# Patient Record
Sex: Male | Born: 1970 | Race: White | Hispanic: No | Marital: Single | Smoking: Current every day smoker
Health system: Southern US, Community
[De-identification: ages and names within clinical notes are randomized; demographics above are authoritative.]

## PROBLEM LIST (undated history)

## (undated) DIAGNOSIS — F329 Major depressive disorder, single episode, unspecified: Secondary | ICD-10-CM

## (undated) DIAGNOSIS — Z21 Asymptomatic human immunodeficiency virus [HIV] infection status: Secondary | ICD-10-CM

## (undated) DIAGNOSIS — B20 Human immunodeficiency virus [HIV] disease: Secondary | ICD-10-CM

## (undated) DIAGNOSIS — F419 Anxiety disorder, unspecified: Secondary | ICD-10-CM

## (undated) DIAGNOSIS — F32A Depression, unspecified: Secondary | ICD-10-CM

## (undated) HISTORY — PX: HERNIA REPAIR: SHX51

---

## 2010-05-28 ENCOUNTER — Ambulatory Visit: Payer: Self-pay | Admitting: Adult Health

## 2010-05-28 DIAGNOSIS — B20 Human immunodeficiency virus [HIV] disease: Secondary | ICD-10-CM

## 2010-05-28 LAB — CONVERTED CEMR LAB
ALT: 19 units/L (ref 0–53)
AST: 22 units/L (ref 0–37)
Alkaline Phosphatase: 67 units/L (ref 39–117)
BUN: 13 mg/dL (ref 6–23)
Bilirubin Urine: NEGATIVE
Bilirubin, Direct: 0.1 mg/dL (ref 0.0–0.3)
Calcium: 9.8 mg/dL (ref 8.4–10.5)
Cholesterol: 126 mg/dL (ref 0–200)
Glucose, Bld: 69 mg/dL — ABNORMAL LOW (ref 70–99)
Glucose, Urine, Semiquant: NEGATIVE
Indirect Bilirubin: 0.4 mg/dL (ref 0.0–0.9)
Nitrite: NEGATIVE
Urobilinogen, UA: NEGATIVE
pH: 6

## 2010-05-29 ENCOUNTER — Encounter: Payer: Self-pay | Admitting: Adult Health

## 2010-05-29 DIAGNOSIS — F329 Major depressive disorder, single episode, unspecified: Secondary | ICD-10-CM

## 2010-05-29 DIAGNOSIS — F1910 Other psychoactive substance abuse, uncomplicated: Secondary | ICD-10-CM

## 2010-06-06 ENCOUNTER — Encounter: Payer: Self-pay | Admitting: Adult Health

## 2010-06-11 ENCOUNTER — Telehealth: Payer: Self-pay | Admitting: Adult Health

## 2010-06-15 ENCOUNTER — Ambulatory Visit: Payer: Self-pay | Admitting: Adult Health

## 2010-06-25 ENCOUNTER — Encounter: Payer: Self-pay | Admitting: Adult Health

## 2010-07-16 ENCOUNTER — Ambulatory Visit: Admit: 2010-07-16 | Payer: Self-pay | Admitting: Adult Health

## 2010-07-24 ENCOUNTER — Ambulatory Visit
Admission: RE | Admit: 2010-07-24 | Discharge: 2010-07-24 | Payer: Self-pay | Source: Home / Self Care | Attending: Adult Health | Admitting: Adult Health

## 2010-07-24 ENCOUNTER — Encounter: Payer: Self-pay | Admitting: Adult Health

## 2010-07-24 DIAGNOSIS — Z8709 Personal history of other diseases of the respiratory system: Secondary | ICD-10-CM | POA: Insufficient documentation

## 2010-07-24 LAB — CONVERTED CEMR LAB
ALT: 22 units/L (ref 0–53)
AST: 21 units/L (ref 0–37)
Albumin: 4.8 g/dL (ref 3.5–5.2)
BUN: 15 mg/dL (ref 6–23)
Bilirubin, Direct: 0.1 mg/dL (ref 0.0–0.3)
CO2: 25 meq/L (ref 19–32)
Chloride: 106 meq/L (ref 96–112)
Glucose, Bld: 80 mg/dL (ref 70–99)
Phosphorus: 4.1 mg/dL (ref 2.3–4.6)
Potassium: 4.2 meq/L (ref 3.5–5.3)
Total Protein: 7.3 g/dL (ref 6.0–8.3)

## 2010-08-07 NOTE — Assessment & Plan Note (Signed)
Summary: STUDY APPT/ LH   Vital Signs:  Patient profile:   40 year old male Height:      72 inches Weight:      178.25 pounds BMI:     24.26 Temp:     98.3 degrees F oral Pulse rate:   75 / minute Resp:     16 per minute BP sitting:   123 / 90  (right arm) Is Patient Diabetic? No Research Study Name: ARDENT Pain Assessment Patient in pain? no      Patient here for his first visit with [place]. He is transferring from [facility] on study A5257. He is currently on Truvada, Darunavir and Ritonavir and will be supplied with Truvada and Darunavir through the study. He says he is almost out of Norvir, and I gave him a bottle from the clinic supply until he can get a prescription filled. He is unsure of his copays and I am assisting him through his company's mail delivery supplier. He may need to get them through the [place] assistance program if his co-pays are too high. He has a med ins. policy that has a cap on prescriptions. He needs to see the NP as a new patient and also the eligibility specialist for [employer]. I am requesting clinic records from [facility] since I have access to his study chart only. He apparently has not been seen there since Feb 2011 and had missed a couple of study appts. He was supposed to have been followed up for hematuria through Urology, but had not kept any of those appts. 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 He c/o depression, trouble sleeping, fatigue and Lt knee bothering him. He says it has been giving out on him and doesn't feel right. He currently takes no other medications. He says he is dating a woman from [place] who is also HIV positive and had questions about transmission risks. I will schedule him an appt. with Nida Boatman once his labs come back. He will return to see me in January which will get him back on schedule with the study.Deirdre Evener RN  May 28, 2010 2:35 PM     Other Orders: Est. Patient Research Study  [phone]) T-Basic Metabolic Panel [phone]) T-Hepatic Function [phone]) T-Lipid Profile [phone]) T-Urinalysis Dipstick only [phone]) T-Phosphorus [phone])  Laboratory Results   Urine Tests  Date/Time Recieved: 05/28/10 1145 Date/Time Reported: 05/28/10 1241  Routine Urinalysis   Color: yellow Appearance: Clear Glucose: negative   (Normal Range: Negative) Bilirubin: negative   (Normal Range: Negative) Ketone: negative   (Normal Range: Negative) Spec. Gravity: 1.025   (Normal Range: 1.003-1.035) Blood: moderate   (Normal Range: Negative) pH: 6.0   (Normal Range: 5.0-8.0) Protein: negative   (Normal Range: Negative) Urobilinogen: negative   (Normal Range: 0-1) Nitrite: negative   (Normal Range: Negative) Leukocyte Esterace: negative   (Normal Range: Negative)

## 2010-08-07 NOTE — Miscellaneous (Signed)
  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 Added new problem of DEPRESSION (ICD-311) Medications: Added new medication of TRUVADA 200-300 MG TABS (EMTRICITABINE-TENOFOVIR) 1 by mouth daily Added new medication of PREZISTA 400 MG TABS (DARUNAVIR ETHANOLATE) 2 by mouth daily Added new medication of NORVIR 100 MG TABS (RITONAVIR) 1 by mouth daily Allergies: Added new allergy or adverse reaction of SULFAMETHOXAZOLE-TMP DS (SULFAMETHOXAZOLE-TRIMETHOPRIM) Added new allergy or adverse reaction of DAPSONE (DAPSONE) Observations: Added new observation of DRUG USE: Yes (05/29/2010 9:29) Added new observation of HIV INIT DX: 01/12/2008 (05/29/2010 9:29) Added new observation of ALCOHOL USE: occassional (05/29/2010 9:29) Added new observation of ALCOHOL COMM: Yes (05/29/2010 9:29) Added new observation of SMOK HX PPD: 1  (05/29/2010 9:29) Added new observation of SMOK STATUS: Current  (05/29/2010 9:29) Added new observation of NKA: F  (05/29/2010 9:29)     Infectious Disease New Patient Intake  Referring MD/Agency: [employer] Medical Records: Requested Medical Insurance: Yes     Insurance Type: Other   Other Insurance: [employer] Does insurance cover prescriptions? Yes Our patient has been informed that medication assistance programs are available.  Our Co-ordinator will be meeting with the patient during this visit to discuss financial and medication assistance.    Medical History/Family History Medication Allergies: Yes   Current Allergies:  ! SULFAMETHOXAZOLE-TMP DS (SULFAMETHOXAZOLE-TRIMETHOPRIM) ! DAPSONE (DAPSONE) Medications: Yes   Current Meds:  TRUVADA 200-300 MG TABS (EMTRICITABINE-TENOFOVIR) 1 by mouth daily PREZISTA 400 MG TABS (DARUNAVIR ETHANOLATE) 2 by mouth daily NORVIR 100 MG TABS (RITONAVIR) 1 by mouth daily   Family History Heart disease: Yes   MI at  83 Cancer: Yes   Comment:  Grandfather lung cancer Smoking History: Current Smoking (packs/day): 1  5.Used to smoke (packs/day): 1  Behavioral Health Assessment Information Have you ever been diagnosed with depression or mental illness? Yes  Diagnosis: Depression Do you drink alcohol? Yes Frequency: occassional Do you use recreational drugs? Yes Type of Use: HX of marijuana, crack, meth Recreational drug use: Yes  HIV Intake Information When did you first test positive for HIV? 01/12/2008 Type of test Conducted: oraquick   Test [facility] Performed At: [facility]   Was this your first time ever being tested or HIV? Yes    Method of Exposure to HIV: Have you ever been under the care of a physician for being HIV positive? Yes  Newly Diagnosed Patients   Have you ever taken HIV medications? Yes Are you currently taking HIV medications? Yes

## 2010-08-07 NOTE — Miscellaneous (Signed)
Summary: HIPAA Restrictions  HIPAA Restrictions   Imported By: Florinda Marker 05/29/2010 11:47:37  _____________________________________________________________________  External Attachment:    Type:   Image     Comment:   External Document

## 2010-08-07 NOTE — Consult Note (Signed)
Summary: [facility] 08/01/08  [facility] 08/01/08   Imported By: Florinda Marker 06/06/2010 09:59:36  _____________________________________________________________________  External Attachment:    Type:   Image     Comment:   External Document

## 2010-08-07 NOTE — Miscellaneous (Signed)
Summary: HIV-1 RNA, CD4 (RESEARCH)  Clinical Lists Changes  Observations: Added new observation of CD4 COUNT: 669 microliters (05/28/2010 14:50) Added new observation of HIV1RNA QA: 40 copies/mL (05/28/2010 14:50)

## 2010-08-09 NOTE — Progress Notes (Signed)
  Phone Note Call from Patient Call back at Home Phone [phone]   Caller: Patient Reason for Call: Acute Illness Summary of Call: Norman Brooks called and said his urinary problem was getting worse. Over more than a week he has been having dark (blood?) tinged urine with some discomfort when he voids. Frequency of voiding has increased also. He has not had an intake yet with a Norman Brooks. We scheduled him an appt this Friday at 9am with [facility] as a new intake. I told him to go to an Urgent Care if signs got worse. I also told him to bring his info in to sign up for [employer].Deirdre Evener RN  June 11, 2010 10:51 AM Initial call taken by: Deirdre Evener RN,  June 11, 2010 10:51 AM

## 2010-08-09 NOTE — Consult Note (Signed)
Summary: [facility]   Imported By: Florinda Marker 06/25/2010 15:24:43  _____________________________________________________________________  External Attachment:    Type:   Image     Comment:   External Document

## 2010-08-09 NOTE — Miscellaneous (Signed)
Summary: [facility]   Imported By: Florinda Marker 06/25/2010 16:02:54  _____________________________________________________________________  External Attachment:    Type:   Image     Comment:   External Document

## 2010-08-29 NOTE — Miscellaneous (Signed)
Summary: HIV-1 RNA, CD4 (RESEARCH)  Clinical Lists Changes  Observations: Added new observation of CD4 COUNT: 652 microliters (07/24/2010 10:02) Added new observation of HIV1RNA QA: 82 copies/mL (07/24/2010 10:02)

## 2010-09-03 ENCOUNTER — Ambulatory Visit: Payer: Self-pay | Admitting: Adult Health

## 2010-09-05 ENCOUNTER — Ambulatory Visit (INDEPENDENT_AMBULATORY_CARE_PROVIDER_SITE_OTHER): Payer: BC Managed Care – PPO | Admitting: Adult Health

## 2010-09-05 ENCOUNTER — Ambulatory Visit (HOSPITAL_COMMUNITY)
Admission: RE | Admit: 2010-09-05 | Discharge: 2010-09-05 | Disposition: A | Discharge status: Home / Self Care | Payer: BC Managed Care – PPO | Source: Ambulatory Visit | Attending: Infectious Diseases | Admitting: Infectious Diseases

## 2010-09-05 ENCOUNTER — Encounter: Payer: Self-pay | Admitting: Adult Health

## 2010-09-05 ENCOUNTER — Other Ambulatory Visit: Payer: Self-pay | Admitting: Infectious Diseases

## 2010-09-05 DIAGNOSIS — R05 Cough: Secondary | ICD-10-CM

## 2010-09-05 DIAGNOSIS — Z87448 Personal history of other diseases of urinary system: Secondary | ICD-10-CM

## 2010-09-05 DIAGNOSIS — J13 Pneumonia due to Streptococcus pneumoniae: Secondary | ICD-10-CM | POA: Insufficient documentation

## 2010-09-05 DIAGNOSIS — L03119 Cellulitis of unspecified part of limb: Secondary | ICD-10-CM

## 2010-09-05 DIAGNOSIS — R059 Cough, unspecified: Secondary | ICD-10-CM | POA: Insufficient documentation

## 2010-09-05 DIAGNOSIS — L02419 Cutaneous abscess of limb, unspecified: Secondary | ICD-10-CM | POA: Insufficient documentation

## 2010-09-05 DIAGNOSIS — B20 Human immunodeficiency virus [HIV] disease: Secondary | ICD-10-CM

## 2010-09-05 DIAGNOSIS — R509 Fever, unspecified: Secondary | ICD-10-CM | POA: Insufficient documentation

## 2010-09-06 ENCOUNTER — Ambulatory Visit: Payer: Self-pay | Admitting: Adult Health

## 2010-09-13 NOTE — Miscellaneous (Signed)
  Clinical Lists Changes  Problems: Added new problem of PNEUMONIA, LEFT LOWER LOBE (ICD-481)

## 2010-09-13 NOTE — Assessment & Plan Note (Signed)
Summary: 2WK F/U/VS   Vital Signs:  Patient profile:   40 year old male Height:      72 inches Weight:      177.6 pounds BMI:     24.17 O2 Sat:      98 % on Room air Temp:     97.9 degrees F oral Pulse rate:   94 / minute BP sitting:   133 / 77  (right arm)  Vitals Entered By: Alesia Morin CMA (September 05, 2010 4:30 PM)  O2 Flow:  Room air CC: follow-up visit for labs. Pt aslo says that he has been running a fever for about 3 days of about 101 and coughing with green mucus and stuffy nose. Chest hurts and nose stuffiness is moving. Is Patient Diabetic? No Pain Assessment Patient in pain? no      Nutritional Status BMI of 19 -24 = normal Nutritional Status Detail appetite "so-so"  Have you ever been in a relationship where you felt threatened, hurt or afraid?No   Does patient need assistance? Functional Status Self care Ambulation Normal Comments no missed doses   CC:  follow-up visit for labs. Pt aslo says that he has been running a fever for about 3 days of about 101 and coughing with green mucus and stuffy nose. Chest hurts and nose stuffiness is moving.Marland Kitchen  History of Present Illness: Presents for lab f/u, but recent labs were drawn in January 2012.  Also with 5-day hx of cough, head congestion, and fatiugue.  Fever developing over past 3 days with Tmax of 101.  Associated chills and sweats with fever and cough.   (+) SOB, non-exertional fatigue, some DOE, and pleuritic-like CP.  No nausea or vomitting reported.   Cough productive with foul-tasting sputum.  Denies hemoptysis.  Also endorses ongoing pain in (L) knee where he was previously treated for cellulitis on his last visit to clini.  Claims pain (L) knee with stand-alone FWB along with weakness in leg and tenderness to (L) patellar region.  Denies locked joint, significant joint swelling, or open wound with drainage.  Preventive Screening-Counseling & Management  Alcohol-Tobacco     Alcohol drinks/day: occassional    Smoking Status: Current     Packs/Day: 1  Safety-Violence-Falls     Seat Belt Use: yes      Sexual History:  currently monogamous.        Drug Use:  Yes.        Blood Transfusions:  no.    Allergies: 1)  ! Sulfamethoxazole-Tmp Ds (Sulfamethoxazole-Trimethoprim) 2)  ! Dapsone (Dapsone)  Social History: Sexual History:  currently monogamous Blood Transfusions:  no  Review of Systems General:  Complains of chills, fatigue, fever, loss of appetite, malaise, sweats, and weakness. Eyes:  Denies blurring, discharge, double vision, eye irritation, eye pain, halos, and light sensitivity. ENT:  Complains of decreased hearing, earache, nasal congestion, postnasal drainage, sinus pressure, and sore throat; "popping" sensation AU. CV:  Complains of difficulty breathing at night, fatigue, and shortness of breath with exertion; denies fainting, lightheadness, near fainting, palpitations, swelling of feet, and swelling of hands. Resp:  Complains of chest discomfort, chest pain with inspiration, cough, pleuritic, shortness of breath, and sputum productive; denies coughing up blood and wheezing. GI:  Denies abdominal pain, bloody stools, change in bowel habits, constipation, dark tarry stools, diarrhea, excessive appetite, gas, hemorrhoids, indigestion, loss of appetite, nausea, vomiting, vomiting blood, and yellowish skin color. GU:  Denies decreased libido, discharge, dysuria, erectile dysfunction, genital sores, hematuria,  incontinence, nocturia, urinary frequency, and urinary hesitancy. MS:  Complains of joint pain, joint redness, muscle aches, and stiffness. Derm:  Denies changes in color of skin, changes in nail beds, dryness, excessive perspiration, flushing, hair loss, insect bite(s), itching, lesion(s), poor wound healing, and rash. Neuro:  Denies difficulty with concentration, disturbances in coordination, falling down, headaches, inability to speak, memory loss, numbness, poor balance,  seizures, sensation of room spinning, tingling, tremors, visual disturbances, and weakness. Psych:  Complains of irritability; denies alternate hallucination ( auditory/visual), anxiety, depression, easily angered, easily tearful, panic attacks, sense of great danger, suicidal thoughts/plans, thoughts of violence, unusual visions or sounds, and thoughts /plans of harming others.  Physical Exam  General:  alert, well-developed, well-nourished, and well-hydrated.   Head:  normocephalic and atraumatic.   Eyes:  vision grossly intact, pupils equal, pupils round, and pupils reactive to light.   Ears:  R TM retraction and L TM retraction.   Nose:  no external deformity, external erythema, nasal dischargemucosal pallor, L frontal sinus tenderness, L maxillary sinus tenderness, R frontal sinus tenderness, and R maxillary sinus tenderness.   Mouth:  good dentition, no gingival abnormalities, no dental plaque, and postnasal drip.   Neck:  supple, full ROM, and no masses.   Chest Wall:  no deformities, no tenderness, and no masses.   Lungs:  Ronchi heard in upper ant. chest over major airwaysnormal respiratory effort, no intercostal retractions, L decreased breath sounds, and L base crackles.   Heart:  normal rate, regular rhythm, no murmur, no gallop, and no rub.   Abdomen:  soft, non-tender, normal bowel sounds, no distention, no hepatomegaly, and no splenomegaly.   Msk:  Slight erythema over anterior surface of left patellar region with point tenderness.  limited flexion (L) vs (R) at knee.  No swelling or joint instability at (L) knee. Pulses:  R and L carotid,radial,femoral,dorsalis pedis and posterior tibial pulses are full and equal bilaterally Extremities:  No clubbing, cyanosis, edema, or deformity noted.   Neurologic:  alert & oriented X3, cranial nerves II-XII intact, strength normal in all extremities, and gait normal.   Skin:  Intact without suspicious lesions or rashes Cervical Nodes:  Shoddy  LAD A&P Axillary Nodes:  R axillary LN enlarged and L axillary LN enlarged.  Bilaterally non-tender Psych:  Cognition and judgment appear intact. Alert and cooperative with normal attention span and concentration. No apparent delusions, illusions, hallucinations   Impression & Recommendations:  Problem # 1:  COUGH (ICD-786.2)  with fever, SO, DOE, and pleuritic CP concern over developing pneumonia is raised CXR 2-views Avelox 400mg  by mouth once daily x 10 days Cetirizine 10mg  by mouth once daily x 2 weeks then as needed Tussionex 5 cc by mouth q12h as needed Ibuprofen 400-600mg  q4-6 h as needed Force Fliuids Bed rest - No work Go to [facility] or ED if cough worsens or develops post-tussive emesis Call clinic if fevers not better by Friday this week  His updated medication list for this problem includes:    Avelox 400 Mg Tabs (Moxifloxacin hcl) .Marland Kitchen... 1 tablet by mouth once daily x 10 days    Hydrocod Polst-chlorphen Polst 10-8 Mg/58ml Lqcr (Hydrocod polst-chlorphen polst) .Marland Kitchen... 1 teaspoon (5 ml) by mouth every 12 hours as needed cough  Orders: CXR- 2view (CXR)  The following medications were removed from the medication list:    Doxycycline Hyclate 100 Mg Caps (Doxycycline hyclate) .Marland Kitchen... 1 cap by mouth every 12 hours until gone. His updated medication list for this  problem includes:    Avelox 400 Mg Tabs (Moxifloxacin hcl) .Marland Kitchen... 1 tablet by mouth once daily x 10 days    Hydrocod Polst-chlorphen Polst 10-8 Mg/98ml Lqcr (Hydrocod polst-chlorphen polst) .Marland Kitchen... 1 teaspoon (5 ml) by mouth every 12 hours as needed cough  Problem # 2:  CELLULITIS, KNEE, LEFT (ICD-682.6)  Due to the duration and timeline of presenting symptoms, an MRI of the (L) knee is warranted.  Any abnormalities determined should be referred to orthopedics or sports medicine for further evaluation and care. The following medications were removed from the medication list:    Doxycycline Hyclate 100 Mg Caps (Doxycycline hyclate)  .Marland Kitchen... 1 cap by mouth every 12 hours until gone. His updated medication list for this problem includes:    Avelox 400 Mg Tabs (Moxifloxacin hcl) .Marland Kitchen... 1 tablet by mouth once daily x 10 days  Orders: MRI with & without Contrast (MRI w&w/o Contrast) T-Sed Rate (Automated) (36644-03474) Est. 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  Repeat U/A and obtain urine for cytology.  Will come by clinic to give specimen when he returns for his MRI.  Orders: T-Urinalysis (38756-43329) Est. Patient Level IV (51884)  Problem # 4:  HIV INFECTION (ICD-042)  CD4 07/24/2010  652 with VL of 82 copies/ml.  Although small incremental rise in VL noted, it still remains too low to determine a genotype.  If we continue seeing small incremental increases, we may need to consider treatment intensification or changing one class of agents.  Will pend repeat lab values at next scheduled staging blood draw per research protocol.  Meanwhile we will continue Prezista, Norvir, and Truvada therapy.  Verbally acknowledged and agreed with plan. His updated medication list for this problem includes:      Orders: Est. Patient Level IV (16606)  Medications Added to Medication List This Visit: 1)  Avelox 400 Mg Tabs (Moxifloxacin hcl) .Marland Kitchen.. 1 tablet by mouth once daily x 10 days 2)  Hydrocod Polst-chlorphen Polst 10-8 Mg/24ml Lqcr (Hydrocod polst-chlorphen polst) .Marland Kitchen.. 1 teaspoon (5 ml) by mouth every 12 hours as needed cough 3)  Cetirizine Hcl 10 Mg Tabs (Cetirizine hcl) .Marland Kitchen.. 1 tablet by mouth once daily x 2 weeks then prn  Patient Instructions: 1)  Recommend increasing fluid intake for hydration for the next few days. 2)  Take 400-600mg  of Ibuprofen (Advil, Motrin) every 4-6 hours as needed for relief of pain or comfort of fever. 3)  Take antibitoics until gone. 4)  Bed rest - no work for the next  4-5 days. 5)  If couging persists in spite of medications or worsens or induces vomitting go to urgent care or ER for evaluation. 6)  Please get chest X-ray today. 7)  Call clinic by Friday if fever persists. 8)  We will call you with the scheduled date and time of your MRI.  When you come in for the MRI, stop by the clinic to collect another urine sample. Prescriptions: CETIRIZINE HCL 10 MG TABS (CETIRIZINE HCL) 1 tablet by mouth once daily x 2 weeks then PRN  #30 x 2   Entered and Authorized by:   Talmadge Chad NP   Signed by:   Talmadge Chad NP on 09/05/2010   Method used:   Print then Give to Patient   RxID:   [id number] HYDROCOD POLST-CHLORPHEN POLST 10-8 MG/5ML LQCR (HYDROCOD POLST-CHLORPHEN POLST) 1 teaspoon (5 ml)  by mouth every 12 hours as needed cough  #8 oz x 0   Entered and Authorized by:   Talmadge Chad NP   Signed by:   Talmadge Chad NP on 09/05/2010   Method used:   Print then Give to Patient   RxID:   [id number] AVELOX 400 MG TABS (MOXIFLOXACIN HCL) 1 tablet by mouth once daily x 10 days  #10 x 0   Entered and Authorized by:   Talmadge Chad NP   Signed by:   Talmadge Chad NP on 09/05/2010   Method used:   Print then Give to Patient   RxID:   [id number]    Orders Added: 1)  CXR- 2view [CXR] 2)  MRI with & without Contrast [facility] w&w/o Contrast] 3)  T-Urinalysis [81003-65000] 4)  T-Sed Rate (Automated) [84696-29528] 5)  Est. Patient Level IV [41324]

## 2010-09-13 NOTE — Assessment & Plan Note (Signed)
Summary: resh from 1/9 new to you [mkj]   CC:  new pt. to establish, c/o numbness right fingers, rash several different areas, and bump on testicles and left kneecap.  Preventive Screening-Counseling & Management  Alcohol-Tobacco     Alcohol drinks/day: occassional     Smoking Status: Current     Packs/Day: 1  Caffeine-Diet-Exercise     Caffeine use/day: one soda per day     Does Patient Exercise: no  Safety-Violence-Falls     Seat Belt Use: no     Seat Belt Counseling: to use seat belts when in vehicle      Sexual History:  currently monogamous.        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    Comments: pt. declined condoms   Current Allergies (reviewed today): ! SULFAMETHOXAZOLE-TMP DS (SULFAMETHOXAZOLE-TRIMETHOPRIM) ! DAPSONE (DAPSONE) Social History: Sexual History:  currently monogamous Drug Use:  current, Yes crystal meth  Vital Signs:  Patient profile:   40 year old male Height:      72 inches (182.88 cm) Weight:      175.5 pounds (79.77 kg) BMI:     23.89 Temp:     97.8 degrees F (36.56 degrees C) oral Pulse rate:   90 / minute BP sitting:   129 / 99  (right arm)  Vitals Entered By: Wendall Mola CMA Duncan Dull) (July 24, 2010 10:04 AM) CC: new pt. to establish, c/o numbness right fingers, rash several different areas, bump on testicles and left kneecap Is Patient Diabetic? No Pain Assessment Patient in pain? no      Nutritional Status BMI of 19 -24 = normal Nutritional Status Detail appetite "good"  Have you ever been in a relationship where you felt threatened, hurt or afraid?No   Does patient need assistance? Functional Status Self care Ambulation Normal Comments pt. has missed one dose of meds in three months    Other Orders: Influenza Vaccine NON MCR (16109) Pneumococcal Vaccine (60454) Admin 1st Vaccine (09811) TB Skin Test (91478) Admin of Any Addtl Vaccine (29562) New Patient Level V (13086)     Immunizations  Administered:  Influenza Vaccine # 1:    Vaccine Type: Fluvax Non-MCR    Site: right deltoid    Mfr: Novartis    Dose: 0.5 ml    Route: IM    Given by: Wendall Mola CMA ( AAMA)    Exp. Date: 10/07/2010    Lot #: [id number]    VIS given: 01/30/10 version given July 24, 2010.  Pneumonia Vaccine:    Vaccine Type: Pneumovax    Site: right deltoid    Mfr: Merck    Dose: 0.5 ml    Route: IM    Given by: Wendall Mola CMA ( AAMA)    Exp. Date: 11/15/2011    Lot #: [id number]    VIS given: 06/12/09 version given July 24, 2010.  PPD Skin Test:    Vaccine Type: PPD    Site: left forearm    Mfr: Sanofi Pasteur    Dose: 0.1 ml    Route: ID    Given by: Wendall Mola CMA ( AAMA)    Exp. Date: 01/12/2012    Lot #: [id number]  Flu Vaccine Consent Questions:    Do you have a history of severe allergic reactions to this vaccine? no    Any prior history of allergic reactions to egg and/or gelatin? no    Do you have a sensitivity to the preservative Thimersol?  no    Do you have a past history of Guillan-Barre Syndrome? no    Do you currently have an acute febrile illness? no    Have you ever had a severe reaction to latex? no    Vaccine information given and explained to patient? yes

## 2010-10-11 ENCOUNTER — Other Ambulatory Visit: Payer: Self-pay | Admitting: *Deleted

## 2010-10-11 DIAGNOSIS — L03116 Cellulitis of left lower limb: Secondary | ICD-10-CM

## 2010-10-15 ENCOUNTER — Other Ambulatory Visit: Payer: Self-pay | Admitting: Adult Health

## 2010-10-15 DIAGNOSIS — B20 Human immunodeficiency virus [HIV] disease: Secondary | ICD-10-CM

## 2010-10-19 ENCOUNTER — Ambulatory Visit (HOSPITAL_COMMUNITY)
Admission: RE | Admit: 2010-10-19 | Discharge: 2010-10-19 | Payer: BC Managed Care – PPO | Source: Ambulatory Visit | Attending: Adult Health | Admitting: Adult Health

## 2010-10-19 ENCOUNTER — Other Ambulatory Visit (INDEPENDENT_AMBULATORY_CARE_PROVIDER_SITE_OTHER): Payer: BC Managed Care – PPO

## 2010-10-19 DIAGNOSIS — B20 Human immunodeficiency virus [HIV] disease: Secondary | ICD-10-CM

## 2010-10-19 LAB — T-HELPER CELL (CD4) - (RCID CLINIC ONLY): CD4 % Helper T Cell: 35 % (ref 33–55)

## 2010-10-19 LAB — URINALYSIS, ROUTINE W REFLEX MICROSCOPIC
Bilirubin Urine: NEGATIVE
Glucose, UA: NEGATIVE mg/dL
Protein, ur: NEGATIVE mg/dL
Specific Gravity, Urine: 1.031 — ABNORMAL HIGH (ref 1.005–1.030)
pH: 6 (ref 5.0–8.0)

## 2010-10-20 LAB — COMPLETE METABOLIC PANEL WITH GFR
ALT: 20 U/L (ref 0–53)
Albumin: 4.5 g/dL (ref 3.5–5.2)
CO2: 22 mEq/L (ref 19–32)
GFR, Est African American: >60 mL/min (ref >60)
GFR, Est Non African American: >60 mL/min (ref >60)
Glucose, Bld: 98 mg/dL (ref 70–99)
Potassium: 3.7 mEq/L (ref 3.5–5.3)
Sodium: 141 mEq/L (ref 135–145)
Total Protein: 7 g/dL (ref 6.0–8.3)

## 2010-10-20 LAB — CBC WITH DIFFERENTIAL/PLATELET
Hemoglobin: 14.2 g/dL (ref 13.0–17.0)
Lymphocytes Relative: 27 % (ref 12–46)
Lymphs Abs: 2 10*3/uL (ref 0.7–4.0)
Monocytes Relative: 8 % (ref 3–12)
Neutro Abs: 4.4 10*3/uL (ref 1.7–7.7)
Neutrophils Relative %: 61 % (ref 43–77)
Platelets: 288 10*3/uL (ref 150–400)
RBC: 4.65 MIL/uL (ref 4.22–5.81)
WBC: 7.2 10*3/uL (ref 4.0–10.5)

## 2010-10-20 LAB — HEPATITIS C ANTIBODY: HCV Ab: NEGATIVE

## 2010-10-20 LAB — HEPATITIS B SURFACE ANTIGEN: Hepatitis B Surface Ag: NEGATIVE

## 2010-10-20 LAB — HIV-1 RNA ULTRAQUANT REFLEX TO GENTYP+
HIV 1 RNA Quant: 30 copies/mL — ABNORMAL HIGH (ref <20)
HIV-1 RNA Quant, Log: 1.48 {Log} — ABNORMAL HIGH (ref <1.30)

## 2010-10-20 LAB — HEPATITIS B SURFACE ANTIBODY,QUALITATIVE: Hep B S Ab: POSITIVE — AB

## 2010-10-20 LAB — URINALYSIS, MICROSCOPIC ONLY
Bacteria, UA: NONE SEEN
Casts: NONE SEEN

## 2010-11-06 ENCOUNTER — Ambulatory Visit: Payer: BC Managed Care – PPO | Admitting: Adult Health

## 2010-11-16 ENCOUNTER — Ambulatory Visit: Payer: BC Managed Care – PPO | Admitting: Adult Health

## 2010-11-19 ENCOUNTER — Encounter: Payer: Self-pay | Admitting: *Deleted

## 2010-11-23 ENCOUNTER — Ambulatory Visit (INDEPENDENT_AMBULATORY_CARE_PROVIDER_SITE_OTHER): Payer: BC Managed Care – PPO | Admitting: Adult Health

## 2010-11-23 ENCOUNTER — Encounter: Payer: Self-pay | Admitting: *Deleted

## 2010-11-23 ENCOUNTER — Ambulatory Visit (INDEPENDENT_AMBULATORY_CARE_PROVIDER_SITE_OTHER): Payer: BC Managed Care – PPO | Admitting: *Deleted

## 2010-11-23 VITALS — Temp 97.9°F | Resp 16 | Ht 72.0 in | Wt 180.8 lb

## 2010-11-23 VITALS — BP 120/76 | HR 88 | Temp 97.0°F | Ht 72.0 in | Wt 180.0 lb

## 2010-11-23 DIAGNOSIS — B20 Human immunodeficiency virus [HIV] disease: Secondary | ICD-10-CM

## 2010-11-23 DIAGNOSIS — Z202 Contact with and (suspected) exposure to infections with a predominantly sexual mode of transmission: Secondary | ICD-10-CM

## 2010-11-23 DIAGNOSIS — Z113 Encounter for screening for infections with a predominantly sexual mode of transmission: Secondary | ICD-10-CM

## 2010-11-23 LAB — LIPID PANEL
Cholesterol: 176 mg/dL (ref 0–200)
HDL: 49 mg/dL (ref >39)
Total CHOL/HDL Ratio: 3.6 Ratio
Triglycerides: 54 mg/dL (ref <150)

## 2010-11-23 LAB — BASIC METABOLIC PANEL
CO2: 23 mEq/L (ref 19–32)
Calcium: 9.9 mg/dL (ref 8.4–10.5)
Creat: 0.98 mg/dL (ref 0.40–1.50)
Sodium: 141 mEq/L (ref 135–145)

## 2010-11-23 LAB — POCT URINALYSIS DIPSTICK
Leukocytes, UA: NEGATIVE
Nitrite, UA: NEGATIVE
Protein, UA: NEGATIVE
Urobilinogen, UA: 0.2
pH, UA: 6

## 2010-11-23 LAB — PHOSPHORUS: Phosphorus: 3.6 mg/dL (ref 2.3–4.6)

## 2010-11-23 MED ORDER — PENICILLIN G BENZATHINE 1200000 UNIT/2ML IM SUSP: 1.2000 10*6.[IU] | Freq: Once | INTRAMUSCULAR | Status: DC

## 2010-11-23 MED ORDER — PENICILLIN G BENZATHINE 1200000 UNIT/2ML IM SUSP
1.2000 10*6.[IU] | Freq: Once | INTRAMUSCULAR | Status: AC
  Administered 2010-11-23 (×2): 1.2 10*6.[IU] via INTRAMUSCULAR

## 2010-11-23 NOTE — Progress Notes (Signed)
Patient here for week 144 A5257 study. Patient expressed concern about having a checkup on his kidneys. He previously had blood in his urine and an eval by a urologist a few years ago. He was told then he needed to come back for followup in 6 months and it has now been 2 years. UA dipstick showing moderate amount of blood. Continues to have trouble focusing, sleeping and fatigue. He has a girlfriend who is positive also and asked about the need for protection with sex. He is seeing Nida Boatman today also.

## 2010-11-24 LAB — RPR

## 2010-12-18 ENCOUNTER — Encounter: Payer: Self-pay | Admitting: Adult Health

## 2010-12-18 LAB — HIV RNA, QUANTITATIVE, PCR: HIV-1 RNA Viral Load: <40

## 2010-12-18 LAB — CD4/CD8 (T-HELPER/T-SUPPRESSOR CELL): CD8 % Suppressor T Cell: 31.6

## 2010-12-19 ENCOUNTER — Other Ambulatory Visit: Payer: Self-pay | Admitting: *Deleted

## 2010-12-19 DIAGNOSIS — B20 Human immunodeficiency virus [HIV] disease: Secondary | ICD-10-CM

## 2010-12-19 MED ORDER — RITONAVIR 100 MG PO CAPS: 100.0000 mg | ORAL_CAPSULE | Freq: Every day | ORAL | Status: DC

## 2010-12-20 ENCOUNTER — Other Ambulatory Visit: Payer: Self-pay | Admitting: Licensed Clinical Social Worker

## 2010-12-20 DIAGNOSIS — B20 Human immunodeficiency virus [HIV] disease: Secondary | ICD-10-CM

## 2010-12-20 MED ORDER — RITONAVIR 100 MG PO CAPS: 100.0000 mg | ORAL_CAPSULE | Freq: Every day | ORAL | Status: DC

## 2010-12-24 ENCOUNTER — Other Ambulatory Visit: Payer: BC Managed Care – PPO

## 2011-02-27 ENCOUNTER — Other Ambulatory Visit: Payer: BC Managed Care – PPO

## 2011-03-05 ENCOUNTER — Other Ambulatory Visit: Payer: BC Managed Care – PPO

## 2011-03-07 ENCOUNTER — Ambulatory Visit (INDEPENDENT_AMBULATORY_CARE_PROVIDER_SITE_OTHER): Payer: Self-pay | Admitting: *Deleted

## 2011-03-07 ENCOUNTER — Encounter: Payer: Self-pay | Admitting: *Deleted

## 2011-03-07 VITALS — BP 119/78 | HR 86 | Temp 97.9°F | Resp 18 | Wt 190.0 lb

## 2011-03-07 DIAGNOSIS — B20 Human immunodeficiency virus [HIV] disease: Secondary | ICD-10-CM

## 2011-03-07 DIAGNOSIS — Z113 Encounter for screening for infections with a predominantly sexual mode of transmission: Secondary | ICD-10-CM

## 2011-03-07 LAB — HEPATIC FUNCTION PANEL
ALT: 18 U/L (ref 0–53)
AST: 17 U/L (ref 0–37)
Albumin: 4.3 g/dL (ref 3.5–5.2)
Total Protein: 7 g/dL (ref 6.0–8.3)

## 2011-03-07 LAB — BASIC METABOLIC PANEL
BUN: 20 mg/dL (ref 6–23)
Chloride: 108 mEq/L (ref 96–112)
Potassium: 4 mEq/L (ref 3.5–5.3)

## 2011-03-07 NOTE — Progress Notes (Signed)
03/07/2011 @ 3pm: Pt here for research study A5257, week 160. Assessment unchanged since last study visit. Pt stated he accidentally cut himself shaving 2 weeks ago. He found out that a friend had used his razor and he is now concerned if he might have "caught something". He requested an RPR. We will check him for syphilis and check a UDS, UA per Traci Sermon NP. Pt also concerned about history of blood in urine and would like a Urology referral. Nida Boatman will review and discuss findings with patient during scheduled visit next week.  VSS. Non-fasting labs drawn. Medications dispensed. Pt received $20 gift card for visit. Next research visit scheduled for Monday, July 14, 2010 at 3pm. -- Tacey Heap RN

## 2011-03-08 LAB — URINALYSIS
Hgb urine dipstick: NEGATIVE
Leukocytes, UA: NEGATIVE
Protein, ur: NEGATIVE mg/dL
Urobilinogen, UA: 0.2 mg/dL (ref 0.0–1.0)

## 2011-03-08 LAB — DRUG SCREEN, URINE
Benzodiazepines.: NEGATIVE
Methadone: NEGATIVE
Propoxyphene: NEGATIVE

## 2011-03-08 LAB — RPR

## 2011-03-13 ENCOUNTER — Ambulatory Visit: Payer: BC Managed Care – PPO | Admitting: Adult Health

## 2011-03-14 ENCOUNTER — Ambulatory Visit: Payer: Self-pay | Admitting: Adult Health

## 2011-03-27 ENCOUNTER — Telehealth: Payer: Self-pay | Admitting: *Deleted

## 2011-03-27 NOTE — Telephone Encounter (Signed)
Patient called about a referral to Urology advised him did not have a referral from the doctor nor an office note about one. Due to this he would have to come for a visit before we could send him to urologist. Was not able to get in touch with the patient so left a voice mail to have him call the office.

## 2011-04-09 ENCOUNTER — Encounter: Payer: Self-pay | Admitting: Adult Health

## 2011-04-09 LAB — CD4/CD8 (T-HELPER/T-SUPPRESSOR CELL)
CD4%: 39.9
CD8 % Suppressor T Cell: 36.7
CD8: 807

## 2011-04-09 LAB — HIV-1 RNA QUANT-NO REFLEX-BLD: HIV-1 RNA Viral Load: <40

## 2011-04-11 ENCOUNTER — Telehealth: Payer: Self-pay | Admitting: *Deleted

## 2011-04-11 ENCOUNTER — Other Ambulatory Visit: Payer: Self-pay | Admitting: *Deleted

## 2011-04-11 DIAGNOSIS — B20 Human immunodeficiency virus [HIV] disease: Secondary | ICD-10-CM

## 2011-04-11 MED ORDER — RITONAVIR 100 MG PO CAPS: 100.0000 mg | ORAL_CAPSULE | Freq: Every day | ORAL | Status: DC

## 2011-04-11 NOTE — Telephone Encounter (Signed)
Patient states his insurance has been canceled and he will need access to ritonavir. He currently works and has a [employer], [employer]/film/video and will probably not be eligible for ADAP. We will apply for norvir assistance through [employer]. He is to come in tomorrow and sign the application.

## 2011-04-22 ENCOUNTER — Encounter: Payer: Self-pay | Admitting: Infectious Disease

## 2011-04-22 ENCOUNTER — Ambulatory Visit (INDEPENDENT_AMBULATORY_CARE_PROVIDER_SITE_OTHER): Payer: Self-pay | Admitting: Infectious Disease

## 2011-04-22 VITALS — BP 117/78 | HR 76 | Temp 97.4°F | Wt 188.1 lb

## 2011-04-22 DIAGNOSIS — L738 Other specified follicular disorders: Secondary | ICD-10-CM

## 2011-04-22 DIAGNOSIS — N4889 Other specified disorders of penis: Secondary | ICD-10-CM

## 2011-04-22 DIAGNOSIS — L739 Follicular disorder, unspecified: Secondary | ICD-10-CM

## 2011-04-22 DIAGNOSIS — Z7251 High risk heterosexual behavior: Secondary | ICD-10-CM

## 2011-04-22 DIAGNOSIS — B20 Human immunodeficiency virus [HIV] disease: Secondary | ICD-10-CM

## 2011-04-22 DIAGNOSIS — N489 Disorder of penis, unspecified: Secondary | ICD-10-CM | POA: Insufficient documentation

## 2011-04-22 DIAGNOSIS — Z23 Encounter for immunization: Secondary | ICD-10-CM

## 2011-04-22 MED ORDER — RITONAVIR 100 MG PO CAPS: 100.0000 mg | ORAL_CAPSULE | Freq: Every day | ORAL | Status: DC

## 2011-04-22 MED ORDER — DOXYCYCLINE HYCLATE 100 MG PO TABS: 100.0000 mg | ORAL_TABLET | Freq: Two times a day (BID) | ORAL | Status: AC

## 2011-04-22 NOTE — Progress Notes (Signed)
Subjective:    Patient ID: Norman Brooks, male    DOB: 1971-05-18, 40 y.o.   MRN: [id number]  HPI  40 year old Caucasian male with HIV suprremely well controlled on prezista,norvir and truvada with VL undetectable at last check in August. He did run out of his norvir yesterday. He is out of insurance and needs to enroll in [employer] (he receives the prezista and truvada via [facility]) He is concerned for the possibility of syphilis because he has a painless penile lesion that developed approximately a month ago. It is oval in shape on the glans the penis. Is not purulent it is not pruritic. He states he's had lesions like this occur before his penis and is applied topical antibiotics and it resolved. He also is an area of irritation on his rhythm he has had in the past as well. Reviewed his labs from August and his RPR was indeed negative. He recounts a history of having had unprotected sexual intercourse 3 weeks prior to this with another woman whose HIV status was unknown. He claims that he informed her of his status. He's otherwise felt well and without fevers chills nausea penile discharge or systemic symptoms.  Review of Systems  Constitutional: Negative for fever, chills, diaphoresis, activity change, appetite change, fatigue and unexpected weight change.  HENT: Negative for congestion, sore throat, rhinorrhea, sneezing, trouble swallowing and sinus pressure.   Eyes: Negative for photophobia and visual disturbance.  Respiratory: Negative for cough, chest tightness, shortness of breath, wheezing and stridor.   Cardiovascular: Negative for chest pain, palpitations and leg swelling.  Gastrointestinal: Negative for nausea, vomiting, abdominal pain, diarrhea, constipation, blood in stool, abdominal distention and anal bleeding.  Genitourinary: Negative for dysuria, hematuria, flank pain and difficulty urinating.  Musculoskeletal: Negative for myalgias, back pain, joint swelling, arthralgias and gait  problem.  Skin: Positive for rash. Negative for color change, pallor and wound.  Neurological: Negative for dizziness, tremors, weakness and light-headedness.  Hematological: Negative for adenopathy. Does not bruise/bleed easily.  Psychiatric/Behavioral: Negative for behavioral problems, confusion, sleep disturbance, dysphoric mood, decreased concentration and agitation.       Objective:   Physical Exam  Constitutional: He is oriented to person, place, and time. He appears well-developed and well-nourished. No distress.  HENT:  Head: Normocephalic and atraumatic.  Mouth/Throat: Oropharynx is clear and moist. No oropharyngeal exudate.  Eyes: Conjunctivae and EOM are normal. Pupils are equal, round, and reactive to light. No scleral icterus.  Neck: Normal range of motion. Neck supple. No JVD present.  Cardiovascular: Normal rate, regular rhythm and normal heart sounds.  Exam reveals no gallop and no friction rub.   No murmur heard. Pulmonary/Chest: Effort normal and breath sounds normal. No respiratory distress. He has no wheezes. He has no rales. He exhibits no tenderness.  Abdominal: He exhibits no distension and no mass. There is no tenderness. There is no rebound and no guarding.  Genitourinary:    Circumcised. No paraphimosis or penile erythema. No discharge found.  Musculoskeletal: He exhibits no edema and no tenderness.  Lymphadenopathy:    He has no cervical adenopathy.  Neurological: He is alert and oriented to person, place, and time. He has normal reflexes. He exhibits normal muscle tone. Coordination normal.  Skin: Skin is warm and dry. He is not diaphoretic. No erythema. No pallor.  Psychiatric: He has a normal mood and affect. His behavior is normal. Judgment and thought content normal.          Assessment &  Plan:  Penile lesion Syphilis is a possibility and he might not have yet seroconverted. However he has had similar lesiosn int he past and never been RPR  positive in our system and by his account. An atypical manifestation of HSV 2 or 1 is possible. I will check RPR with diluition for prozone, GC and chlamydia from urine and serum HSV1 and 2 antibodies. This could be a contact dermatitis or eczema as well. 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 Could be in midst of primary to secondary syphilis but history of repeated flares of this and penile lesion with negatie RPR argues against this  High risk sexual behavior counselled on dangers to himself and others. Need to inform others of status and use condoms. Fortunately his VL is undetectable and by his history so is that of his wife (who is HIV positive)  HIV INFECTION Superbly controlled but needs ADAP, ASAP. Norvir rx written

## 2011-04-22 NOTE — Assessment & Plan Note (Signed)
Superbly controlled but needs ADAP, ASAP. Norvir rx written

## 2011-04-22 NOTE — Assessment & Plan Note (Signed)
counselled on dangers to himself and others. Need to inform others of status and use condoms. Fortunately his VL is undetectable and by his history so is that of his wife (who is HIV positive)

## 2011-04-22 NOTE — Assessment & Plan Note (Signed)
Syphilis is a possibility and he might not have yet seroconverted. However he has had similar lesiosn int he past and never been RPR positive in our system and by his account. An atypical manifestation of HSV 2 or 1 is possible. I will check RPR with diluition for prozone, GC and chlamydia from urine and serum HSV1 and 2 antibodies. This could be a contact dermatitis or eczema as well. Will bring back in one month

## 2011-04-22 NOTE — Assessment & Plan Note (Signed)
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 Could be in midst of primary to secondary syphilis but history of repeated flares of this and penile lesion with negatie RPR argues against this

## 2011-04-23 ENCOUNTER — Other Ambulatory Visit: Payer: Self-pay | Admitting: *Deleted

## 2011-04-23 DIAGNOSIS — B20 Human immunodeficiency virus [HIV] disease: Secondary | ICD-10-CM

## 2011-04-23 LAB — GC/CHLAMYDIA PROBE AMP, URINE: Chlamydia, Swab/Urine, PCR: NEGATIVE

## 2011-04-23 MED ORDER — DARUNAVIR ETHANOLATE 400 MG PO TABS: 800.0000 mg | ORAL_TABLET | Freq: Every day | ORAL | Status: AC

## 2011-04-23 MED ORDER — EMTRICITABINE-TENOFOVIR DF 200-300 MG PO TABS: 1.0000 | ORAL_TABLET | Freq: Every day | ORAL | Status: AC

## 2011-04-23 MED ORDER — RITONAVIR 100 MG PO CAPS: 100.0000 mg | ORAL_CAPSULE | Freq: Every day | ORAL | Status: DC

## 2011-04-24 ENCOUNTER — Other Ambulatory Visit: Payer: Self-pay | Admitting: Infectious Disease

## 2011-04-24 LAB — HSV(HERPES SIMPLEX VRS) I + II AB-IGG: HSV 1 Glycoprotein G Ab, IgG: 15.74 IV — ABNORMAL HIGH

## 2011-04-29 ENCOUNTER — Other Ambulatory Visit: Payer: Self-pay | Admitting: *Deleted

## 2011-04-29 DIAGNOSIS — B20 Human immunodeficiency virus [HIV] disease: Secondary | ICD-10-CM

## 2011-04-29 MED ORDER — RITONAVIR 100 MG PO CAPS: 100.0000 mg | ORAL_CAPSULE | Freq: Every day | ORAL | Status: DC

## 2011-05-02 ENCOUNTER — Other Ambulatory Visit: Payer: Self-pay | Admitting: *Deleted

## 2011-05-02 DIAGNOSIS — B20 Human immunodeficiency virus [HIV] disease: Secondary | ICD-10-CM

## 2011-05-02 MED ORDER — RITONAVIR 100 MG PO CAPS: 100.0000 mg | ORAL_CAPSULE | Freq: Every day | ORAL | Status: DC

## 2011-05-05 ENCOUNTER — Encounter: Payer: Self-pay | Admitting: *Deleted

## 2011-05-05 NOTE — Progress Notes (Signed)
Megan from [facility] said Nygel has gotten approval through ADAP for his norvir. I checked with Pearley to confirm and reminded him he will have to renew every 6 months. I had submitted an application through [employer] and will call and have it canceled.

## 2011-05-07 ENCOUNTER — Other Ambulatory Visit: Payer: Self-pay | Admitting: *Deleted

## 2011-05-07 NOTE — Telephone Encounter (Signed)
Told him we have bottles of norvir here for him. He may have to get them tomorrow. Not sure he can make it by 5

## 2011-05-08 ENCOUNTER — Telehealth: Payer: Self-pay | Admitting: Licensed Clinical Social Worker

## 2011-05-08 DIAGNOSIS — B20 Human immunodeficiency virus [HIV] disease: Secondary | ICD-10-CM

## 2011-05-08 NOTE — Telephone Encounter (Signed)
Patient called because his patient assistance medications had arrived and he wanted his girlfriend to pick them up for him. Her name is Nickola Major and she will be here today getting her lab work done.

## 2011-05-08 NOTE — Telephone Encounter (Signed)
Pt will get his samples today

## 2011-05-22 ENCOUNTER — Ambulatory Visit: Payer: Self-pay | Admitting: Infectious Disease

## 2011-05-28 ENCOUNTER — Ambulatory Visit: Payer: Self-pay | Admitting: Adult Health

## 2011-06-03 ENCOUNTER — Ambulatory Visit: Payer: Self-pay | Admitting: Internal Medicine

## 2011-07-15 ENCOUNTER — Ambulatory Visit (INDEPENDENT_AMBULATORY_CARE_PROVIDER_SITE_OTHER): Payer: Self-pay | Admitting: *Deleted

## 2011-07-15 VITALS — BP 125/84 | HR 92 | Temp 98.0°F | Resp 18 | Wt 188.5 lb

## 2011-07-15 DIAGNOSIS — B20 Human immunodeficiency virus [HIV] disease: Secondary | ICD-10-CM

## 2011-07-15 NOTE — Progress Notes (Signed)
Assessment unchanged since last study visit. Pt denies any new findings, problems or symptoms. Informed patient that study will be closing and the next visit will be his last. Currently uninsured and getting Norvir through ADAP. Pt given information to get in touch with Britta Mccreedy so that he can renew his ADAP and apply for RW and [employer]. Pt verbalized understanding.   Pt questioned a urology referral, for blood in urine, that Traci Sermon NP had spoke about. There is no talk of this in his notes. I see he had moderate amount of blood in urine 11/2010. Spoke with Dr. Daiva Eves about this and he said to get a UA if having symptoms and he could refer to urologist if necessary. Pt currently denies any pain, difficulty, changes in frequency, stream, or color changes while urinating. Pt and I decided to wait since this is not an urgent matter. Nonfasting labs were drawn and $20.00 gift card given for study visit. Study medications were dispensed. Next appointment scheduled for Monday, October 21, 2011 at 9:00am. Tacey Heap RN

## 2011-07-16 LAB — BASIC METABOLIC PANEL
BUN: 20 mg/dL (ref 6–23)
Calcium: 9.9 mg/dL (ref 8.4–10.5)
Glucose, Bld: 91 mg/dL (ref 70–99)
Potassium: 4.2 mEq/L (ref 3.5–5.3)
Sodium: 140 mEq/L (ref 135–145)

## 2011-07-16 LAB — PHOSPHORUS: Phosphorus: 3.7 mg/dL (ref 2.3–4.6)

## 2011-07-26 ENCOUNTER — Encounter: Payer: Self-pay | Admitting: Adult Health

## 2011-07-26 LAB — CD4/CD8 (T-HELPER/T-SUPPRESSOR CELL)
CD4: 686
CD8: 656

## 2011-07-30 ENCOUNTER — Ambulatory Visit: Payer: Self-pay | Admitting: Infectious Disease

## 2011-07-30 ENCOUNTER — Telehealth: Payer: Self-pay | Admitting: Licensed Clinical Social Worker

## 2011-07-30 NOTE — Telephone Encounter (Signed)
Rescheduled patient to 08/12/11 he missed his appointment today with Dr. Daiva Eves.

## 2011-08-01 ENCOUNTER — Encounter: Payer: Self-pay | Admitting: Infectious Disease

## 2011-08-12 ENCOUNTER — Ambulatory Visit (INDEPENDENT_AMBULATORY_CARE_PROVIDER_SITE_OTHER): Payer: Self-pay | Admitting: Infectious Disease

## 2011-08-12 ENCOUNTER — Encounter: Payer: Self-pay | Admitting: Infectious Disease

## 2011-08-12 VITALS — BP 144/91 | HR 98 | Temp 98.3°F | Ht 72.0 in | Wt 186.0 lb

## 2011-08-12 DIAGNOSIS — F329 Major depressive disorder, single episode, unspecified: Secondary | ICD-10-CM

## 2011-08-12 DIAGNOSIS — G4700 Insomnia, unspecified: Secondary | ICD-10-CM

## 2011-08-12 DIAGNOSIS — F1910 Other psychoactive substance abuse, uncomplicated: Secondary | ICD-10-CM

## 2011-08-12 DIAGNOSIS — F322 Major depressive disorder, single episode, severe without psychotic features: Secondary | ICD-10-CM | POA: Insufficient documentation

## 2011-08-12 DIAGNOSIS — Z7251 High risk heterosexual behavior: Secondary | ICD-10-CM

## 2011-08-12 DIAGNOSIS — B20 Human immunodeficiency virus [HIV] disease: Secondary | ICD-10-CM

## 2011-08-12 MED ORDER — TRAZODONE HCL 50 MG PO TABS: 50.0000 mg | ORAL_TABLET | Freq: Every day | ORAL | Status: DC

## 2011-08-12 MED ORDER — CITALOPRAM HYDROBROMIDE 20 MG PO TABS: 20.0000 mg | ORAL_TABLET | Freq: Every day | ORAL | Status: DC

## 2011-08-12 NOTE — Assessment & Plan Note (Signed)
HIV well controlled, admits to missing a few doses. Good he is on PI based regimen

## 2011-08-12 NOTE — Assessment & Plan Note (Signed)
Still ongoing issue for him. His insight is poor

## 2011-08-12 NOTE — Progress Notes (Signed)
Subjective:    Patient ID: Norman Brooks, male    DOB: 10-26-1970, 41 y.o.   MRN: [id number]  HPI  Norman Brooks is a 41 y.o. male who is doing reasonably  well on their antiviral regimen, with low viral load and health cd4 count.   He presents to clinic for followup today and is severely depressed. He denies suicidal or homicidal ideation. He contracts for safety. He endorses active IV methamphetamine use. He denies etoh use. He endorses NEVER using condoms with his HIV positive wife who he states is undetectable.   He statest that if he were back on adderal he might not use so much IV meth. He accepted rx for SSRI but walked out the door before I could hand it to him. He refused to see [facility] counselor. I spent greater than 45 minutes with the patient including greater than 50% of time in face to face counsel of the patient and in coordination of their care.    Review of Systems  Constitutional: Negative for fever, chills, diaphoresis, activity change, appetite change, fatigue and unexpected weight change.  HENT: Negative for congestion, sore throat, rhinorrhea, sneezing, trouble swallowing and sinus pressure.   Eyes: Negative for photophobia and visual disturbance.  Respiratory: Negative for cough, chest tightness, shortness of breath, wheezing and stridor.   Cardiovascular: Negative for chest pain, palpitations and leg swelling.  Gastrointestinal: Negative for nausea, vomiting, abdominal pain, diarrhea, constipation, blood in stool, abdominal distention and anal bleeding.  Genitourinary: Negative for dysuria, hematuria, flank pain and difficulty urinating.  Musculoskeletal: Negative for myalgias, back pain, joint swelling, arthralgias and gait problem.  Skin: Negative for color change, pallor, rash and wound.  Neurological: Negative for dizziness, tremors, weakness and light-headedness.  Hematological: Negative for adenopathy. Does not bruise/bleed easily.  Psychiatric/Behavioral:  Positive for confusion, sleep disturbance, dysphoric mood and agitation. Negative for suicidal ideas, behavioral problems, self-injury and decreased concentration.       Objective:   Physical Exam  Constitutional: He is oriented to person, place, and time. He appears well-developed and well-nourished. No distress.  HENT:  Head: Normocephalic and atraumatic.  Mouth/Throat: Oropharynx is clear and moist. No oropharyngeal exudate.  Eyes: Conjunctivae and EOM are normal. Pupils are equal, round, and reactive to light. No scleral icterus.  Neck: Normal range of motion. Neck supple. No JVD present.  Cardiovascular: Normal rate, regular rhythm and normal heart sounds.  Exam reveals no gallop and no friction rub.   No murmur heard. Pulmonary/Chest: Effort normal and breath sounds normal. No respiratory distress. He has no wheezes. He has no rales. He exhibits no tenderness.  Abdominal: He exhibits no distension and no mass. There is no tenderness. There is no rebound and no guarding.  Musculoskeletal: He exhibits no edema and no tenderness.  Lymphadenopathy:    He has no cervical adenopathy.  Neurological: He is alert and oriented to person, place, and time. He has normal reflexes. He exhibits normal muscle tone. Coordination normal.  Skin: Skin is warm and dry. He is not diaphoretic. No erythema. No pallor.  Psychiatric: Thought content normal. His affect is blunt, labile and inappropriate. He is withdrawn. Thought content is not delusional. He expresses impulsivity and inappropriate judgment. He exhibits a depressed mood. He expresses no suicidal plans and no homicidal plans.          Assessment & Plan:  HIV INFECTION HIV well controlled, admits to missing a few doses. Good he is on PI based regimen  High risk sexual  behavior He is NOT at all receptive to idea of using protection. Fortunately his VL is undetectable  DEPRESSION Will start him on SSRI. Ordered referral to  psychiatry  SUBSTANCE ABUSE, MULTIPLE Still ongoing issue for him. His insight is poor  Insomnia Start trazadone

## 2011-08-12 NOTE — Assessment & Plan Note (Signed)
Will start him on SSRI. Ordered referral to psychiatry

## 2011-08-12 NOTE — Assessment & Plan Note (Signed)
Start trazadone

## 2011-08-12 NOTE — Assessment & Plan Note (Signed)
He is NOT at all receptive to idea of using protection. Fortunately his VL is undetectable

## 2011-08-19 DIAGNOSIS — Z202 Contact with and (suspected) exposure to infections with a predominantly sexual mode of transmission: Secondary | ICD-10-CM | POA: Insufficient documentation

## 2011-08-19 NOTE — Assessment & Plan Note (Signed)
His real reason for coming to clinic, had nothing to do with hematuria, but more related to her exposure to syphilis as his partner informed him that he tested positive. We will, therefore, obtain an RPR today and start him on benzathine penicillin IM for weekly injections until the results are known. Safer sexual practices with the use of condoms were discussed in detail and he was offered condoms, but refused them at this visit.

## 2011-08-19 NOTE — Progress Notes (Signed)
Patient ID: Norman Brooks, male   DOB: Aug 25, 1970, 41 y.o.   MRN: [id number] [name] presented to clinic today originally complaining of hematuria, although that was not his true concern as this did not seem to be an active problem. Right now. He did, however express concern that his recent sexual partner tested positive for syphilis, and they had unprotected penetrated intercourse. He denies, the development of any lesions or sores on his penis or in his mouth, but that he needs to be tested for syphilis and possibly be treated.  Objective:  Oropharynx is clear without any signs of lesions or chancre. Genitalia normal without signs of lesions, discharges or chancres.  Assessment/plan:  Exposure to syphilis His real reason for coming to clinic, had nothing to do with hematuria, but more related to her exposure to syphilis as his partner informed him that he tested positive. We will, therefore, obtain an RPR today and start him on benzathine penicillin IM for weekly injections until the results are known. Safer sexual practices with the use of condoms were discussed in detail and he was offered condoms, but refused them at this visit.

## 2011-09-09 ENCOUNTER — Ambulatory Visit: Payer: Self-pay | Admitting: Infectious Disease

## 2011-09-19 ENCOUNTER — Encounter: Payer: Self-pay | Admitting: *Deleted

## 2011-09-19 NOTE — Progress Notes (Signed)
Patient ID: Norman Brooks, male   DOB: 1971-07-01, 41 y.o.   MRN: [id number]  Pt referred to psychiatry for sever depression after being seen in Dr. Zenaida Niece Dams office on 08/12/2011. Tried to get in touch with patient to see if he still needed services. I left a message on cell and informed patient I would send him a general letter with Serina Cowper Branch's card. I informed him he would need to reach out to her if he still wanted to speak with a couselor. Letter is in the mail to patient and filled out intake form for Alisa and placed it in her box. Tacey Heap RN

## 2011-10-21 ENCOUNTER — Ambulatory Visit (INDEPENDENT_AMBULATORY_CARE_PROVIDER_SITE_OTHER): Payer: Self-pay | Admitting: *Deleted

## 2011-10-21 VITALS — BP 117/81 | HR 90 | Temp 97.5°F | Resp 18 | Ht 72.0 in | Wt 188.5 lb

## 2011-10-21 DIAGNOSIS — Z21 Asymptomatic human immunodeficiency virus [HIV] infection status: Secondary | ICD-10-CM

## 2011-10-21 DIAGNOSIS — B20 Human immunodeficiency virus [HIV] disease: Secondary | ICD-10-CM

## 2011-10-21 LAB — BASIC METABOLIC PANEL
BUN: 22 mg/dL (ref 6–23)
CO2: 23 mEq/L (ref 19–32)
Chloride: 104 mEq/L (ref 96–112)
Creat: 0.73 mg/dL (ref 0.50–1.35)
Potassium: 4.4 mEq/L (ref 3.5–5.3)

## 2011-10-21 LAB — POCT URINALYSIS DIPSTICK
Glucose, UA: NEGATIVE
Leukocytes, UA: NEGATIVE
Nitrite, UA: NEGATIVE
Protein, UA: NEGATIVE
Spec Grav, UA: >=1.03
Urobilinogen, UA: 0.2

## 2011-10-21 LAB — HEPATIC FUNCTION PANEL
Albumin: 4.4 g/dL (ref 3.5–5.2)
Alkaline Phosphatase: 69 U/L (ref 39–117)
Total Bilirubin: 0.3 mg/dL (ref 0.3–1.2)
Total Protein: 7.3 g/dL (ref 6.0–8.3)

## 2011-10-21 LAB — LIPID PANEL
HDL: 46 mg/dL (ref >39)
LDL Cholesterol: 91 mg/dL (ref 0–99)
VLDL: 48 mg/dL — ABNORMAL HIGH (ref 0–40)

## 2011-10-21 NOTE — Progress Notes (Signed)
Pt here for FINAL visit A5257, week 192. Pt was informed of study closure at last visit and we discussed the letter to study participants. Pt verbalized understanding, signed letter, and received copy of letter. Assessment is unchanged since last study visit. Pt states he has RW and ADAP and will make appointment with Kandice Robinsons for renewal of these. Pt also was seen by Dr. Daiva Eves 08/2011 and I informed him to make another appointment 1 month before he runs out of ARV's so he can discuss options and get prescription. Pt verbalized understanding. Non-fasting labs were drawn. Pt received $20 gift card for study visit. Tacey Heap RN

## 2011-11-13 ENCOUNTER — Telehealth: Payer: Self-pay | Admitting: *Deleted

## 2011-11-13 ENCOUNTER — Ambulatory Visit: Payer: Self-pay | Admitting: Family Medicine

## 2011-11-13 ENCOUNTER — Encounter: Payer: Self-pay | Admitting: Internal Medicine

## 2011-11-13 VITALS — BP 139/88 | HR 101 | Temp 97.9°F | Resp 18 | Ht 72.0 in | Wt 185.0 lb

## 2011-11-13 DIAGNOSIS — B20 Human immunodeficiency virus [HIV] disease: Secondary | ICD-10-CM

## 2011-11-13 DIAGNOSIS — R1110 Vomiting, unspecified: Secondary | ICD-10-CM

## 2011-11-13 DIAGNOSIS — M79669 Pain in unspecified lower leg: Secondary | ICD-10-CM

## 2011-11-13 DIAGNOSIS — R197 Diarrhea, unspecified: Secondary | ICD-10-CM

## 2011-11-13 DIAGNOSIS — L03119 Cellulitis of unspecified part of limb: Secondary | ICD-10-CM

## 2011-11-13 LAB — CD4/CD8 (T-HELPER/T-SUPPRESSOR CELL)
CD4%: 40.2
CD4: 764

## 2011-11-13 LAB — HIV-1 RNA QUANT-NO REFLEX-BLD: HIV-1 RNA Viral Load: 67

## 2011-11-13 NOTE — Telephone Encounter (Signed)
Patient called stating he has a left knee infection and needed to be seen today to be put on antibiotics.  Explained we do not have any available appointments today and offered him the next available which is not until next week.  He said he would go to urgent care. Wendall Mola CMA

## 2011-11-13 NOTE — Progress Notes (Signed)
  Subjective:    Patient ID: Norman Brooks, male    DOB: 1970/08/05, 41 y.o.   MRN: [id number]  HPI Norman Brooks is a 41 y.o. male  L knee pain- past 4 days.  Had a few itchy bumps, then redness.  Worsening pain, and hard to bend leg yesterday.  Fever 101 yesterday.  Diarrhea 6 times per day for past 2 days. Has been taking random antibiotics to try to treat infection - friends antibiotics leftover.  Took 4 augmentin over 2 days, 6 keflex over 1 day (yesterday).  Redness seems better since yesterday.  augmentin and keflex taken once this am.  No fever yet today.  Vomiting last night - 2 episodes. No vomiting today, has had 3-4 episodes of diarrhea today.  Problems at times over past 1 year, but no known injury - tight at times.  Planned on MRI in past, but as sx's improved, did not have MRI.  Hx HIV, last CD4 unknown, but no known risks.  Viral load undetectable 1 month ago.  ID = Dr. Loraine Grip Sweep. 1 pack per day smoker.  No other drug use or IVDU. No alcohol. Sexually active - new male partner 2 weeks ago, no condom was used. sex with males and females.  No known gonorrhea/chlamydia in past.     Review of Systems  Constitutional: Negative for fever and chills.  Gastrointestinal: Positive for vomiting and diarrhea. Negative for abdominal pain.  Genitourinary: Negative for discharge.  Skin: Positive for rash.       Objective:   Physical Exam  Constitutional: He is oriented to person, place, and time. He appears well-developed and well-nourished. No distress.  Pulmonary/Chest: Effort normal.  Abdominal: Soft. Normal appearance. There is no tenderness.    Musculoskeletal:       Left knee: He exhibits decreased range of motion, swelling and erythema.       Legs:      Diffuse anterior L lower leg erythema from knee to proximal ankle.  Few scattered erythematous papules anterior knee, none with apparent significant induration/exudate.  Decreased ROM on left - 90 degrees flex,  lacks about 20 degrees extension.   Antalgic, minimal WB on left - offered crutches - declined.  Neurological: He is alert and oriented to person, place, and time.  Skin: Skin is warm and dry.  Psychiatric: He has a normal mood and affect. His behavior is normal.      Assessment & Plan:  Norman Brooks is a 41 y.o. male Hx HIV, but undetectable viral load with normal CD4 by report with Left leg cellulitis, fever yesterday. Reports progression of erythema today - concerning for MRSA cellulitis, and worsening even with taking Augmentin and Keflex on his own for past 2 days.  Crutches offered - declined.  Plans on eval at [facility] ER tonight for possible IV antibiotics/hosptal admission.  Diarrhea, vomiting with outpatient use of antibiotics - med side effect likely vs. Less likely C diff.  Eval at [facility] ER.  Triage nurse advised.

## 2011-12-10 ENCOUNTER — Other Ambulatory Visit: Payer: Self-pay | Admitting: Infectious Disease

## 2011-12-10 DIAGNOSIS — B20 Human immunodeficiency virus [HIV] disease: Secondary | ICD-10-CM

## 2011-12-12 ENCOUNTER — Other Ambulatory Visit: Payer: Self-pay

## 2012-01-23 ENCOUNTER — Emergency Department (HOSPITAL_BASED_OUTPATIENT_CLINIC_OR_DEPARTMENT_OTHER)
Admission: EM | Admit: 2012-01-23 | Discharge: 2012-01-23 | Disposition: A | Discharge status: Home / Self Care | Payer: Self-pay | Attending: Emergency Medicine | Admitting: Emergency Medicine

## 2012-01-23 ENCOUNTER — Emergency Department (HOSPITAL_BASED_OUTPATIENT_CLINIC_OR_DEPARTMENT_OTHER): Payer: Self-pay

## 2012-01-23 ENCOUNTER — Encounter (HOSPITAL_BASED_OUTPATIENT_CLINIC_OR_DEPARTMENT_OTHER): Payer: Self-pay | Admitting: *Deleted

## 2012-01-23 DIAGNOSIS — N200 Calculus of kidney: Secondary | ICD-10-CM | POA: Insufficient documentation

## 2012-01-23 DIAGNOSIS — Z21 Asymptomatic human immunodeficiency virus [HIV] infection status: Secondary | ICD-10-CM | POA: Insufficient documentation

## 2012-01-23 DIAGNOSIS — R8271 Bacteriuria: Secondary | ICD-10-CM

## 2012-01-23 DIAGNOSIS — F17200 Nicotine dependence, unspecified, uncomplicated: Secondary | ICD-10-CM | POA: Insufficient documentation

## 2012-01-23 DIAGNOSIS — B20 Human immunodeficiency virus [HIV] disease: Secondary | ICD-10-CM

## 2012-01-23 LAB — COMPREHENSIVE METABOLIC PANEL
Albumin: 4 g/dL (ref 3.5–5.2)
BUN: 15 mg/dL (ref 6–23)
Calcium: 9.3 mg/dL (ref 8.4–10.5)
GFR calc Af Amer: >90 mL/min (ref >90)
Glucose, Bld: 99 mg/dL (ref 70–99)
Sodium: 140 mEq/L (ref 135–145)
Total Protein: 7.2 g/dL (ref 6.0–8.3)

## 2012-01-23 LAB — URINE MICROSCOPIC-ADD ON

## 2012-01-23 LAB — CBC WITH DIFFERENTIAL/PLATELET
Basophils Relative: 0 % (ref 0–1)
Eosinophils Absolute: 0.4 10*3/uL (ref 0.0–0.7)
Eosinophils Relative: 4 % (ref 0–5)
Lymphs Abs: 1.6 10*3/uL (ref 0.7–4.0)
MCH: 30.6 pg (ref 26.0–34.0)
MCHC: 35 g/dL (ref 30.0–36.0)
MCV: 87.3 fL (ref 78.0–100.0)
Monocytes Relative: 10 % (ref 3–12)
Neutrophils Relative %: 68 % (ref 43–77)
Platelets: 254 10*3/uL (ref 150–400)
RBC: 4.09 MIL/uL — ABNORMAL LOW (ref 4.22–5.81)

## 2012-01-23 LAB — URINALYSIS, ROUTINE W REFLEX MICROSCOPIC
Bilirubin Urine: NEGATIVE
Ketones, ur: NEGATIVE mg/dL
Nitrite: NEGATIVE
Urobilinogen, UA: 0.2 mg/dL (ref 0.0–1.0)

## 2012-01-23 LAB — LIPASE, BLOOD: Lipase: 24 U/L (ref 11–59)

## 2012-01-23 MED ORDER — OXYCODONE-ACETAMINOPHEN 5-325 MG PO TABS: 1.0000 | ORAL_TABLET | ORAL | Status: AC | PRN

## 2012-01-23 MED ORDER — KETOROLAC TROMETHAMINE 30 MG/ML IJ SOLN
30.0000 mg | Freq: Once | INTRAMUSCULAR | Status: AC
  Administered 2012-01-23: 30 mg via INTRAVENOUS
  Filled 2012-01-23: qty 1

## 2012-01-23 MED ORDER — TAMSULOSIN HCL 0.4 MG PO CAPS: 0.4000 mg | ORAL_CAPSULE | Freq: Every day | ORAL | Status: DC

## 2012-01-23 MED ORDER — ONDANSETRON HCL 4 MG/2ML IJ SOLN
4.0000 mg | Freq: Once | INTRAMUSCULAR | Status: AC
  Administered 2012-01-23: 4 mg via INTRAVENOUS
  Filled 2012-01-23: qty 2

## 2012-01-23 MED ORDER — MORPHINE SULFATE 4 MG/ML IJ SOLN
4.0000 mg | Freq: Once | INTRAMUSCULAR | Status: AC
  Administered 2012-01-23: 4 mg via INTRAVENOUS
  Filled 2012-01-23: qty 1

## 2012-01-23 MED ORDER — HYDROMORPHONE HCL PF 1 MG/ML IJ SOLN
1.0000 mg | Freq: Once | INTRAMUSCULAR | Status: AC
  Administered 2012-01-23: 1 mg via INTRAVENOUS
  Filled 2012-01-23: qty 1

## 2012-01-23 MED ORDER — ONDANSETRON 4 MG PO TBDP: 4.0000 mg | ORAL_TABLET | Freq: Three times a day (TID) | ORAL | Status: AC | PRN

## 2012-01-23 MED ORDER — SODIUM CHLORIDE 0.9 % IV BOLUS (SEPSIS)
1000.0000 mL | Freq: Once | INTRAVENOUS | Status: AC
  Administered 2012-01-23: 1000 mL via INTRAVENOUS

## 2012-01-23 NOTE — ED Notes (Signed)
Pt given cup of water per MD request of [facility].

## 2012-01-23 NOTE — ED Notes (Signed)
Upon entry of this rn to room pt is lying on his side, texting on cell phone in nad. When pt sees this rn, becomes restless, holding his abd with hands and crying out "I can't take this pain, please help me..." pt noted spitting onto floor, given emesis bag and requested to spit into bag. Pt verbalizes understanding.

## 2012-01-23 NOTE — ED Provider Notes (Signed)
History     CSN: [id number]  Arrival date & time 01/23/12  4098   First MD Initiated Contact with Patient 01/23/12 [phone]      Chief Complaint  Patient presents with  . Abdominal Pain  . Back Pain    (Consider location/radiation/quality/duration/timing/severity/associated sxs/prior treatment) HPI  H/o HIV (CD4 764, viral load 60s 10/2011) pw abdominal pain, sudden onset R flank pain/back pain, pounding and dull. Intermittent, 10/10 at worse. Multiple episode of NBNB emesis. Has been unable to tolerate PO. Denies fever +Chills. Denies hematuria/dysuria/freq/urgency. No h/o renal stones. 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   ED Notes, ED Provider Notes from 01/23/12 0000 to 01/23/12 07:54:39       Melissa Ramer Brantley, RN 01/23/2012 07:53      2 hour onset of right lower quadrant pain and lower right back pain Reports vomiting x 1     History reviewed. No pertinent past medical history.  History reviewed. No pertinent past surgical history.  History reviewed. No pertinent family history.  History  Substance Use Topics  . Smoking status: Current Everyday Smoker -- 1.0 packs/day for 20 years    Types: Cigarettes  . Smokeless tobacco: Not on file  . Alcohol Use: No    Review of Systems  All other systems reviewed and are negative.   except as noted HPI   Allergies  Dapsone and Sulfamethoxazole w-trimethoprim  Home Medications   Current Outpatient Rx  Name Route Sig Dispense Refill  . CITALOPRAM HYDROBROMIDE 20 MG PO TABS Oral Take 1 tablet (20 mg total) by mouth daily. 30 tablet 11  . DARUNAVIR ETHANOLATE 400 MG PO TABS Oral Take 2 tablets (800 mg total) by mouth daily. 60 tablet 5  . EMTRICITABINE-TENOFOVIR 200-300 MG PO TABS Oral Take 1 tablet by mouth daily. 30 tablet 5  . ONDANSETRON 4 MG PO TBDP Oral Take 1 tablet (4 mg  total) by mouth every 8 (eight) hours as needed for nausea. 10 tablet 0  . OXYCODONE-ACETAMINOPHEN 5-325 MG PO TABS Oral Take 1 tablet by mouth every 4 (four) hours as needed for pain. 20 tablet 0  . RITONAVIR 100 MG PO CAPS Oral Take 1 capsule (100 mg total) by mouth daily. 90 capsule 3    rec'd 3 bottles of norvir # 30 each . Lot [id number]  ...  . TAMSULOSIN HCL 0.4 MG PO CAPS Oral Take 1 capsule (0.4 mg total) by mouth daily. 10 capsule 0  . TRAZODONE HCL 50 MG PO TABS Oral Take 1 tablet (50 mg total) by mouth at bedtime. 30 tablet 11    BP 137/116  Pulse 91  Temp 97.6 F (36.4 C) (Oral)  Resp 20  Ht 6' (1.829 m)  Wt 180 lb (81.647 kg)  BMI 24.41 kg/m2  SpO2 100%  Physical Exam  Nursing note and vitals reviewed. Constitutional: He is oriented to person, place, and time. He appears well-developed and well-nourished. No distress.       Appears to be in pain  HENT:  Head: Atraumatic.  Mouth/Throat: Oropharynx is clear and moist.  Eyes: Conjunctivae are normal. Pupils are equal, round, and reactive to light.  Neck: Neck supple.  Cardiovascular: Normal rate, regular rhythm, normal heart sounds and intact distal pulses.  Exam reveals no gallop and no friction rub.   No murmur heard. Pulmonary/Chest: Effort normal. No respiratory distress. He  has no wheezes. He has no rales.  Abdominal: Soft. Bowel sounds are normal. There is tenderness. There is no rebound and no guarding.       RUQ/RLQ abd ttp  No cvat  Musculoskeletal: Normal range of motion. He exhibits no edema and no tenderness.  Neurological: He is alert and oriented to person, place, and time.  Skin: Skin is warm and dry.  Psychiatric: He has a normal mood and affect.    ED Course  Procedures (including critical care time)  Labs Reviewed  URINALYSIS, ROUTINE W REFLEX MICROSCOPIC - Abnormal; Notable for the following:    APPearance CLOUDY (*)     Hgb urine dipstick MODERATE (*)     All other components within normal  limits  CBC WITH DIFFERENTIAL - Abnormal; Notable for the following:    RBC 4.09 (*)     Hemoglobin 12.5 (*)     HCT 35.7 (*)     All other components within normal limits  COMPREHENSIVE METABOLIC PANEL - Abnormal; Notable for the following:    Potassium 3.4 (*)     All other components within normal limits  URINE MICROSCOPIC-ADD ON - Abnormal; Notable for the following:    Bacteria, UA MANY (*)     All other components within normal limits  LIPASE, BLOOD  URINE CULTURE   Ct Abdomen Wo Contrast  01/23/2012  *RADIOLOGY REPORT*  Clinical Data:  Right lower quadrant and right lower back pain. Vomiting.  CT ABDOMEN AND PELVIS WITHOUT CONTRAST  Technique:  Multidetector CT imaging of the abdomen and pelvis was performed following the standard protocol without intravenous contrast.  Comparison:   None.  Findings:  Lung bases show no acute findings.  Heart size normal. No pericardial or pleural effusion.  Liver, gallbladder and adrenal glands unremarkable.  Right kidney is edematous.  Right ureter is dilated.  A 2 mm stone is seen at the right ureteral vesicle junction.  Left kidney, spleen, pancreas, stomach and bowel are unremarkable.  No pathologically enlarged lymph nodes.  No free fluid.  No worrisome lytic or sclerotic lesions.  IMPRESSION: Mildly obstructing 2 mm stone at the right ureteral vesicle junction.  Original Report Authenticated By: Reyes Ivan, M.D.     1. Renal stone   2. HIV (human immunodeficiency virus infection)   3. Bacteria in urine     MDM  H/o HIV pw sudden onset right flank pain. Found to have renal stone 2mm. Likely to pass. Pain controlled with IVF, zofran, morphine, dilaudid, toradol in the ED. Bacteria in urine but no significant WBCs. Will send for culture. Urine strainer. F/U Urology. Pain controlled and tolerating PO prior to discharge. Aware that he needs to f/u with ID regarding HIV medications.        Forbes Cellar, MD 01/23/12 [phone]

## 2012-01-23 NOTE — ED Notes (Signed)
Pt given pain med. Blood pressure was taken before and after admin. BP before adminIstration 140/102. BP after administration 150/95. Pt tolerated well. Pt currently resting comfortably in room.

## 2012-01-23 NOTE — ED Notes (Signed)
2 hour onset of right lower quadrant pain and lower right back pain  Reports vomiting x 1

## 2012-01-23 NOTE — ED Notes (Signed)
Patient ambulatory to the restroom with no assistance from staff.  

## 2012-01-25 LAB — URINE CULTURE

## 2012-03-02 ENCOUNTER — Other Ambulatory Visit: Payer: Self-pay

## 2012-03-23 ENCOUNTER — Ambulatory Visit: Payer: Self-pay

## 2012-03-23 ENCOUNTER — Other Ambulatory Visit: Payer: Self-pay

## 2012-03-30 ENCOUNTER — Ambulatory Visit: Payer: Self-pay | Admitting: Infectious Disease

## 2012-04-02 ENCOUNTER — Other Ambulatory Visit: Payer: Self-pay

## 2012-04-15 ENCOUNTER — Ambulatory Visit: Payer: Self-pay | Admitting: Infectious Disease

## 2012-04-15 ENCOUNTER — Other Ambulatory Visit: Payer: Self-pay

## 2012-04-16 ENCOUNTER — Other Ambulatory Visit: Payer: Self-pay

## 2012-06-12 ENCOUNTER — Other Ambulatory Visit: Payer: Self-pay

## 2012-06-18 ENCOUNTER — Other Ambulatory Visit: Payer: Self-pay | Admitting: *Deleted

## 2012-06-18 DIAGNOSIS — B20 Human immunodeficiency virus [HIV] disease: Secondary | ICD-10-CM

## 2012-06-18 MED ORDER — RITONAVIR 100 MG PO CAPS: 100.0000 mg | ORAL_CAPSULE | Freq: Every day | ORAL | Status: AC

## 2012-06-24 ENCOUNTER — Other Ambulatory Visit: Payer: Self-pay

## 2012-06-24 ENCOUNTER — Ambulatory Visit: Payer: Self-pay

## 2012-07-15 ENCOUNTER — Ambulatory Visit: Payer: Self-pay

## 2012-07-15 ENCOUNTER — Ambulatory Visit: Payer: Self-pay | Admitting: Infectious Disease

## 2012-12-23 ENCOUNTER — Ambulatory Visit: Payer: BC Managed Care – PPO

## 2012-12-24 ENCOUNTER — Emergency Department (HOSPITAL_BASED_OUTPATIENT_CLINIC_OR_DEPARTMENT_OTHER): Payer: BC Managed Care – PPO

## 2012-12-24 ENCOUNTER — Emergency Department (HOSPITAL_BASED_OUTPATIENT_CLINIC_OR_DEPARTMENT_OTHER)
Admission: EM | Admit: 2012-12-24 | Discharge: 2012-12-24 | Disposition: A | Discharge status: Home / Self Care | Payer: BC Managed Care – PPO | Attending: Emergency Medicine | Admitting: Emergency Medicine

## 2012-12-24 ENCOUNTER — Encounter (HOSPITAL_BASED_OUTPATIENT_CLINIC_OR_DEPARTMENT_OTHER): Payer: Self-pay | Admitting: *Deleted

## 2012-12-24 ENCOUNTER — Other Ambulatory Visit: Payer: Self-pay

## 2012-12-24 DIAGNOSIS — A770 Spotted fever due to Rickettsia rickettsii: Secondary | ICD-10-CM

## 2012-12-24 DIAGNOSIS — H9209 Otalgia, unspecified ear: Secondary | ICD-10-CM | POA: Insufficient documentation

## 2012-12-24 DIAGNOSIS — R5381 Other malaise: Secondary | ICD-10-CM | POA: Insufficient documentation

## 2012-12-24 DIAGNOSIS — R000 Tachycardia, unspecified: Secondary | ICD-10-CM | POA: Insufficient documentation

## 2012-12-24 DIAGNOSIS — Z21 Asymptomatic human immunodeficiency virus [HIV] infection status: Secondary | ICD-10-CM | POA: Insufficient documentation

## 2012-12-24 DIAGNOSIS — Z79899 Other long term (current) drug therapy: Secondary | ICD-10-CM | POA: Insufficient documentation

## 2012-12-24 DIAGNOSIS — R630 Anorexia: Secondary | ICD-10-CM | POA: Insufficient documentation

## 2012-12-24 DIAGNOSIS — R0602 Shortness of breath: Secondary | ICD-10-CM | POA: Insufficient documentation

## 2012-12-24 DIAGNOSIS — R51 Headache: Secondary | ICD-10-CM | POA: Insufficient documentation

## 2012-12-24 DIAGNOSIS — A779 Spotted fever, unspecified: Secondary | ICD-10-CM | POA: Insufficient documentation

## 2012-12-24 DIAGNOSIS — R059 Cough, unspecified: Secondary | ICD-10-CM | POA: Insufficient documentation

## 2012-12-24 DIAGNOSIS — R599 Enlarged lymph nodes, unspecified: Secondary | ICD-10-CM | POA: Insufficient documentation

## 2012-12-24 DIAGNOSIS — F17200 Nicotine dependence, unspecified, uncomplicated: Secondary | ICD-10-CM | POA: Insufficient documentation

## 2012-12-24 DIAGNOSIS — R05 Cough: Secondary | ICD-10-CM | POA: Insufficient documentation

## 2012-12-24 HISTORY — DX: Human immunodeficiency virus (HIV) disease: B20

## 2012-12-24 HISTORY — DX: Asymptomatic human immunodeficiency virus (hiv) infection status: Z21

## 2012-12-24 LAB — CBC WITH DIFFERENTIAL/PLATELET
Basophils Absolute: 0 10*3/uL (ref 0.0–0.1)
Basophils Relative: 0 % (ref 0–1)
Eosinophils Relative: 3 % (ref 0–5)
HCT: 39.5 % (ref 39.0–52.0)
Hemoglobin: 14.2 g/dL (ref 13.0–17.0)
Lymphocytes Relative: 24 % (ref 12–46)
MCV: 86.1 fL (ref 78.0–100.0)
Monocytes Relative: 11 % (ref 3–12)
Neutro Abs: 4 10*3/uL (ref 1.7–7.7)
RBC: 4.59 MIL/uL (ref 4.22–5.81)
RDW: 12.3 % (ref 11.5–15.5)
WBC: 6.5 10*3/uL (ref 4.0–10.5)

## 2012-12-24 LAB — URINE MICROSCOPIC-ADD ON

## 2012-12-24 LAB — COMPREHENSIVE METABOLIC PANEL
AST: 26 U/L (ref 0–37)
CO2: 25 mEq/L (ref 19–32)
Chloride: 103 mEq/L (ref 96–112)
Creatinine, Ser: 0.6 mg/dL (ref 0.50–1.35)
GFR calc Af Amer: >90 mL/min (ref >90)
GFR calc non Af Amer: >90 mL/min (ref >90)
Glucose, Bld: 96 mg/dL (ref 70–99)
Total Bilirubin: 0.2 mg/dL — ABNORMAL LOW (ref 0.3–1.2)

## 2012-12-24 LAB — CG4 I-STAT (LACTIC ACID): Lactic Acid, Venous: 1.32 mmol/L (ref 0.5–2.2)

## 2012-12-24 LAB — URINALYSIS, ROUTINE W REFLEX MICROSCOPIC
Protein, ur: 30 mg/dL — AB
Urobilinogen, UA: 1 mg/dL (ref 0.0–1.0)

## 2012-12-24 LAB — LACTATE DEHYDROGENASE: LDH: 216 U/L (ref 94–250)

## 2012-12-24 MED ORDER — SODIUM CHLORIDE 0.9 % IV BOLUS (SEPSIS)
1000.0000 mL | Freq: Once | INTRAVENOUS | Status: AC
  Administered 2012-12-24: 1000 mL via INTRAVENOUS

## 2012-12-24 MED ORDER — ONDANSETRON HCL 4 MG/2ML IJ SOLN
4.0000 mg | Freq: Once | INTRAMUSCULAR | Status: AC
  Administered 2012-12-24: 4 mg via INTRAVENOUS
  Filled 2012-12-24: qty 2

## 2012-12-24 MED ORDER — DOXYCYCLINE HYCLATE 100 MG PO CAPS: 100.0000 mg | ORAL_CAPSULE | Freq: Two times a day (BID) | ORAL | Status: DC

## 2012-12-24 MED ORDER — OXYCODONE-ACETAMINOPHEN 5-325 MG PO TABS: 1.0000 | ORAL_TABLET | Freq: Four times a day (QID) | ORAL | Status: DC | PRN

## 2012-12-24 MED ORDER — HYDROMORPHONE HCL PF 1 MG/ML IJ SOLN
1.0000 mg | Freq: Once | INTRAMUSCULAR | Status: AC
  Administered 2012-12-24: 1 mg via INTRAVENOUS
  Filled 2012-12-24 (×2): qty 1

## 2012-12-24 MED ORDER — DOXYCYCLINE HYCLATE 100 MG PO TABS
100.0000 mg | ORAL_TABLET | Freq: Once | ORAL | Status: AC
  Administered 2012-12-24: 100 mg via ORAL
  Filled 2012-12-24: qty 1

## 2012-12-24 NOTE — ED Notes (Signed)
Abscess to his neck. Fever.

## 2012-12-24 NOTE — ED Provider Notes (Addendum)
History     CSN: [id number]  Arrival date & time 12/24/12  1813   First MD Initiated Contact with Patient 12/24/12 1832      Chief Complaint  Patient presents with  . Abscess    (Consider location/radiation/quality/duration/timing/severity/associated sxs/prior treatment) Patient is a 42 y.o. male presenting with abscess.  Abscess Location:  Head/neck (girlfriend states taht she pulled 2 ticks out of his scalp/neck 2 weeks ago) Head/neck abscess location:  R neck Size:  Dime sized Abscess quality: painful and redness   Abscess quality: not draining, no induration and no itching   Red streaking: no   Duration:  7 days Progression:  Worsening Pain details:    Quality:  Sharp and shooting   Severity:  Moderate   Timing:  Constant   Progression:  Worsening Chronicity:  New Context: immunosuppression and injected drug use   Context comment:  HIV just recently went back on meds 1 month ago Relieved by:  Nothing Worsened by:  Cold compresses Ineffective treatments:  None tried Associated symptoms: anorexia, fatigue, fever and headaches   Associated symptoms: no nausea and no vomiting     Past Medical History  Diagnosis Date  . HIV (human immunodeficiency virus infection)     History reviewed. No pertinent past surgical history.  No family history on file.  History  Substance Use Topics  . Smoking status: Current Every Day Smoker -- 1.00 packs/day for 20 years    Types: Cigarettes  . Smokeless tobacco: Not on file  . Alcohol Use: No      Review of Systems  Constitutional: Positive for fever and fatigue.       4 days of fever over 101  HENT: Positive for ear pain. Negative for hearing loss, sore throat, facial swelling, trouble swallowing, neck stiffness and voice change.   Respiratory: Positive for cough and shortness of breath. Negative for wheezing.        Non-productive cough  Cardiovascular: Negative for chest pain and leg swelling.  Gastrointestinal:  Positive for anorexia. Negative for nausea, vomiting and abdominal pain.  Genitourinary: Negative for dysuria, frequency, discharge and penile pain.  Neurological: Positive for headaches.  All other systems reviewed and are negative.    Allergies  Dapsone and Sulfamethoxazole w-trimethoprim  Home Medications   Current Outpatient Rx  Name  Route  Sig  Dispense  Refill  . EXPIRED: citalopram (CELEXA) 20 MG tablet   Oral   Take 1 tablet (20 mg total) by mouth daily.   30 tablet   11   . darunavir (PREZISTA) 400 MG tablet   Oral   Take 2 tablets (800 mg total) by mouth daily.   60 tablet   5   . emtricitabine-tenofovir (TRUVADA) 200-300 MG per tablet   Oral   Take 1 tablet by mouth daily.   30 tablet   5   . ritonavir (NORVIR) 100 MG capsule   Oral   Take 1 capsule (100 mg total) by mouth daily.   30 capsule   0   . Tamsulosin HCl (FLOMAX) 0.4 MG CAPS   Oral   Take 1 capsule (0.4 mg total) by mouth daily.   10 capsule   0   . EXPIRED: traZODone (DESYREL) 50 MG tablet   Oral   Take 1 tablet (50 mg total) by mouth at bedtime.   30 tablet   11     BP 122/95  Pulse 106  Temp(Src) 99.4 F (37.4 C) (Oral)  Resp 20  Wt 180 lb (81.647 kg)  BMI 24.41 kg/m2  SpO2 98%  Physical Exam  Nursing note and vitals reviewed. Constitutional: He is oriented to person, place, and time. He appears well-developed and well-nourished. No distress.  HENT:  Head: Normocephalic and atraumatic.    Right Ear: Tympanic membrane and ear canal normal.  Left Ear: Tympanic membrane and ear canal normal.  Mouth/Throat: Uvula is midline, oropharynx is clear and moist and mucous membranes are normal.  Eyes: Conjunctivae and EOM are normal. Pupils are equal, round, and reactive to light.  Neck: Normal range of motion. Neck supple. No rigidity. No Brudzinski's sign and no Kernig's sign noted.  Diffuse cervical lymphadenopathy  Cardiovascular: Regular rhythm and intact distal pulses.   Tachycardia present.   No murmur heard. Pulmonary/Chest: Effort normal and breath sounds normal. No respiratory distress. He has no wheezes. He has no rales.  Abdominal: Soft. He exhibits no distension. There is no tenderness. There is no rebound and no guarding.  Musculoskeletal: Normal range of motion. He exhibits no edema and no tenderness.  Neurological: He is alert and oriented to person, place, and time.  Skin: Skin is warm. No rash noted. He is diaphoretic. No erythema.  Psychiatric: He has a normal mood and affect. His behavior is normal.    ED Course  Procedures (including critical care time)  Labs Reviewed  COMPREHENSIVE METABOLIC PANEL - Abnormal; Notable for the following:    Potassium 3.3 (*)    Albumin 3.4 (*)    Total Bilirubin 0.2 (*)    All other components within normal limits  URINALYSIS, ROUTINE W REFLEX MICROSCOPIC - Abnormal; Notable for the following:    Color, Urine AMBER (*)    Specific Gravity, Urine 1.036 (*)    Hgb urine dipstick LARGE (*)    Bilirubin Urine SMALL (*)    Ketones, ur 15 (*)    Protein, ur 30 (*)    All other components within normal limits  URINE MICROSCOPIC-ADD ON - Abnormal; Notable for the following:    Crystals CA OXALATE CRYSTALS (*)    All other components within normal limits  CBC WITH DIFFERENTIAL  LACTATE DEHYDROGENASE  T-HELPER CELLS (CD4) COUNT  CG4 I-STAT (LACTIC ACID)   Dg Chest 2 View  12/24/2012   *RADIOLOGY REPORT*  Clinical Data: Cough, fever, shortness of breath.  CHEST - 2 VIEW  Comparison: 09/05/2010  Findings: Lungs clear.  Heart size and pulmonary vascularity normal.  No effusion.  Visualized bones unremarkable.  IMPRESSION: No acute disease   Original Report Authenticated By: D. Andria Rhein, MD     Date: 12/24/2012  Rate: 88  Rhythm: normal sinus rhythm  QRS Axis: normal  Intervals: normal  ST/T Wave abnormalities: nonspecific ST/T changes  Conduction Disutrbances:right bundle branch block  Narrative  Interpretation:   Old EKG Reviewed: unchanged    1. [facility] spotted fever       MDM   Patient presents to 4 days of worsening lesion of his neck with lymphadenopathy, fever and general malaise. Girlfriend states that he did have a tick removed from the side of his head close to where this lesion was approximately 2 weeks ago. Also states that he just recently went back on to HIV medications one month ago and does not know when his counts are does not follow with ID regularly.  Patient denies any confusion or neck stiffness. He has cervical adenopathy and an ulcerative lesion on the right side of his neck. Ears, mouth and  throat are within normal limits.  He has no diffuse rashes, abdominal pain or chest pain but does have intermittent cough and shortness of breath. Patient is also an IV drug user last used IV mass approximately 2 weeks ago.  Concern for bacteremia versus [facility] spotted fever versus other infectious etiology.  No murmurs or exam findings concerning for endocarditis.  CBC, CMP, UA, lactic acid, LDH, CD4 count, chest x-ray, EKG pending. Patient given IV fluids and pain control  8:24 PM Labs wnl.  CXR wnl.  Pt improved with fluids.  EKG wnl.  Will cover with doxy for RMSF and skin infection.  Will have f/u with Dr. Algis Liming tomorrow.     Gwyneth Sprout, MD 12/24/12 4098  Gwyneth Sprout, MD 12/24/12 2030

## 2012-12-24 NOTE — ED Notes (Signed)
Lactic drawn by RN. RT ran on I STAT. Hand delivered to Dr. Anitra Lauth @ [phone]

## 2012-12-24 NOTE — Discharge Instructions (Signed)
[place] Spotted Fever [place] Spotted Fever (RMSF) is the oldest known tick-borne disease of people in the [place]. This disease was named because it was first described among people in the [facility] area who had an illness characterized by a rash with red-purple-black spots. This disease is caused by a rickettsia (Rickettsia rickettsii), a bacteria carried by the tick. The [facility] wood tick and the American dog tick, acquire and transmit the RMSF bacteria (pictures NOT actual size). When a larval, nymphal or adult tick feeds on an infected rodent or larger animal, the tick can become infected. Infected adult ticks then feed on people who may then get RMSF. The tick transmits the disease to humans during a prolonged period of feeding that lasts many hours, days or even a couple weeks. The bite is painless and frequently goes unnoticed. An infected male tick may also pass the rickettsial bacteria to her eggs that then may mature to be infected adult ticks. The rickettsia that causes RMSF can also get into a person's body through damaged skin. A tick bite is not necessary. People can get RMSF if they crush a tick and get it's blood or body fluids on their skin through a small cut or sore.  DIAGNOSIS Diagnosis is made by laboratory tests.  TREATMENT Treatment is with antibiotics (medications that kill rickettsia and other bacteria). Immediate treatment usually prevents death. GEOGRAPHIC RANGE This disease was reported only in the [facility] until 1931. RMSF has more recently been described among individuals in all states except [place], [place] and [place]. The highest reported incidences of RMSF now occur among residents of [place], [place], [place] and the [place]. TIME OF YEAR  Most cases are diagnosed during late spring and summer when ticks are most active. However, especially in the warmer [place] states, a few cases occur during the winter. SYMPTOMS    Symptoms of RMSF begin from 2 to 14 days after a tick bite. The most common early symptoms are fever, muscle aches and headache followed by nausea (feeling sick to your stomach) or vomiting.  The RMSF rash is typically delayed until 3 or more days after symptom onset, and eventually develops in 9 of 10 infected patients by the 5th day of illness. If the disease is not treated it can cause death. If you get a fever, headache, muscle aches, rash, nausea or vomiting within 2 weeks of a possible tick bite or exposure you should see your caregiver immediately. PREVENTION Ticks prefer to hide in shady, moist ground litter. They can often be found above the ground clinging to tall grass, brush, shrubs and low tree branches. They also inhabit lawns and gardens, especially at the edges of woodlands and around old stone walls. Within the areas where ticks generally live, no naturally vegetated area can be considered completely free of infected ticks. The best precaution against RMSF is to avoid contact with soil, leaf litter and vegetation as much as possible in tick infested areas. For those who enjoy gardening or walking in their yards, clear brush and mow tall grass around houses and at the edges of gardens. This may help reduce the tick population in the immediate area. Applications of chemical insecticides by a licensed professional in the spring (late May) and Fall (September) will also control ticks, especially in heavily infested areas. Treatment will never get rid of all the ticks. Getting rid of small animal populations that host ticks will also decrease the tick population. When working in the garden,  pruning shrubs, or handling soil and vegetation, wear light-colored protective clothing and gloves. Spot-check often to prevent ticks from reaching the skin. Ticks cannot jump or fly. They will not drop from an above-ground perch onto a passing animal. Once a tick gains access to human skin it climbs upward  until it reaches a more protected area. For example, the back of the knee, groin, navel, armpit, ears or nape of the neck. It then begins the slow process of embedding itself in the skin. Campers, hikers, field workers, and others who spend time in wooded, brushy or tall grassy areas can avoid exposure to ticks by using the following precautions:  Wear light-colored clothing with a tight weave to spot ticks more easily and prevent contact with the skin.  Wear long pants tucked into socks, long-sleeved shirts tucked into pants and enclosed shoes or boots along with insect repellent.  Spray clothes with insect repellent containing either DEET or Permethrin. Only DEET can be used on exposed skin. Follow the manufacturer's directions carefully.  Wear a hat and keep long hair pulled back.  Stay on cleared, well-worn trails whenever possible.  Spot-check yourself and others often for the presence of ticks on clothes. If you find one, there are likely to be others. Check thoroughly.  Remove clothes after leaving tick-infested areas. If possible, wash them to eliminate any unseen ticks. Check yourself, your children and any pets from head to toe for the presence of ticks.  Shower and shampoo. You can greatly reduce your chances of contracting RMSF if you remove attached ticks as soon as possible. Regular checks of the body, including all body sites covered by hair (head, armpits, genitals), allow removal of the tick before rickettsial transmission. To remove an attached tick, use a forceps or tweezers to detach the intact tick without leaving mouth parts in the skin. The tick bite wound should be cleansed after tick removal. Remember the most common symptoms of RMSF are fever, muscle aches, headache and nausea or vomiting with a later onset of rash. If you get these symptoms after a tick bite and while living in an area where RMSF is found, RMSF should be suspected. If the disease is not treated, it can  cause death. See your caregiver immediately if you get these symptoms. Do this even if not aware of a tick bite. Document Released: 10/06/2000 Document Revised: 09/16/2011 Document Reviewed: 05/29/2009 [facility] Patient Information 2014 [place], [place].   Your CD4 counts were drawn and Dr. Daiva Eves should be able to see those tomorrow.  Return for any vomiting, worsening fever, rashes or trouble swallowing.

## 2012-12-29 ENCOUNTER — Ambulatory Visit: Payer: BC Managed Care – PPO | Admitting: Internal Medicine

## 2012-12-29 ENCOUNTER — Telehealth: Payer: Self-pay | Admitting: *Deleted

## 2012-12-29 NOTE — Telephone Encounter (Signed)
Left message notifying patient of his missed appointment - an ED f/u scheduled yesterday 12/28/12.  Pt has not been to clinic in more than a year, has had no ART refills ordered since 2012.  Will refer to [facility]. Andree Coss, RN

## 2013-02-09 ENCOUNTER — Emergency Department (HOSPITAL_BASED_OUTPATIENT_CLINIC_OR_DEPARTMENT_OTHER): Payer: BC Managed Care – PPO

## 2013-02-09 ENCOUNTER — Observation Stay (HOSPITAL_BASED_OUTPATIENT_CLINIC_OR_DEPARTMENT_OTHER)
Admission: EM | Admit: 2013-02-09 | Discharge: 2013-02-10 | Discharge status: Against Medical Advice | Payer: BC Managed Care – PPO | Attending: Internal Medicine | Admitting: Internal Medicine

## 2013-02-09 ENCOUNTER — Encounter (HOSPITAL_BASED_OUTPATIENT_CLINIC_OR_DEPARTMENT_OTHER): Payer: Self-pay

## 2013-02-09 DIAGNOSIS — N179 Acute kidney failure, unspecified: Secondary | ICD-10-CM | POA: Insufficient documentation

## 2013-02-09 DIAGNOSIS — T43621A Poisoning by amphetamines, accidental (unintentional), initial encounter: Secondary | ICD-10-CM

## 2013-02-09 DIAGNOSIS — T43624A Poisoning by amphetamines, undetermined, initial encounter: Principal | ICD-10-CM | POA: Insufficient documentation

## 2013-02-09 DIAGNOSIS — F1510 Other stimulant abuse, uncomplicated: Secondary | ICD-10-CM

## 2013-02-09 DIAGNOSIS — Z21 Asymptomatic human immunodeficiency virus [HIV] infection status: Secondary | ICD-10-CM | POA: Insufficient documentation

## 2013-02-09 DIAGNOSIS — R079 Chest pain, unspecified: Secondary | ICD-10-CM | POA: Insufficient documentation

## 2013-02-09 DIAGNOSIS — B20 Human immunodeficiency virus [HIV] disease: Secondary | ICD-10-CM | POA: Diagnosis present

## 2013-02-09 DIAGNOSIS — R259 Unspecified abnormal involuntary movements: Secondary | ICD-10-CM | POA: Insufficient documentation

## 2013-02-09 DIAGNOSIS — T43591A Poisoning by other antipsychotics and neuroleptics, accidental (unintentional), initial encounter: Secondary | ICD-10-CM

## 2013-02-09 DIAGNOSIS — F1910 Other psychoactive substance abuse, uncomplicated: Secondary | ICD-10-CM

## 2013-02-09 DIAGNOSIS — T43601A Poisoning by unspecified psychostimulants, accidental (unintentional), initial encounter: Secondary | ICD-10-CM | POA: Insufficient documentation

## 2013-02-09 DIAGNOSIS — R4182 Altered mental status, unspecified: Secondary | ICD-10-CM

## 2013-02-09 HISTORY — DX: Depression, unspecified: F32.A

## 2013-02-09 HISTORY — DX: Anxiety disorder, unspecified: F41.9

## 2013-02-09 HISTORY — DX: Major depressive disorder, single episode, unspecified: F32.9

## 2013-02-09 LAB — COMPREHENSIVE METABOLIC PANEL
ALT: 22 U/L (ref 0–53)
AST: 34 U/L (ref 0–37)
Albumin: 4.2 g/dL (ref 3.5–5.2)
Albumin: 3.1 g/dL — ABNORMAL LOW (ref 3.5–5.2)
Alkaline Phosphatase: 70 U/L (ref 39–117)
BUN: 26 mg/dL — ABNORMAL HIGH (ref 6–23)
CO2: 19 mEq/L (ref 19–32)
Calcium: 10.4 mg/dL (ref 8.4–10.5)
Chloride: 103 mEq/L (ref 96–112)
Chloride: 106 mEq/L (ref 96–112)
Creatinine, Ser: 1.2 mg/dL (ref 0.50–1.35)
Creatinine, Ser: 1.1 mg/dL (ref 0.50–1.35)
GFR calc non Af Amer: 83 mL/min — ABNORMAL LOW (ref >90)
Potassium: 4 mEq/L (ref 3.5–5.1)
Sodium: 139 mEq/L (ref 135–145)
Total Bilirubin: 0.5 mg/dL (ref 0.3–1.2)

## 2013-02-09 LAB — RAPID URINE DRUG SCREEN, HOSP PERFORMED
Amphetamines: POSITIVE — AB
Benzodiazepines: NOT DETECTED
Tetrahydrocannabinol: NOT DETECTED

## 2013-02-09 LAB — URINALYSIS, ROUTINE W REFLEX MICROSCOPIC
Bilirubin Urine: NEGATIVE
Glucose, UA: NEGATIVE mg/dL
Ketones, ur: NEGATIVE mg/dL
Leukocytes, UA: NEGATIVE
Protein, ur: 100 mg/dL — AB
pH: 5.5 (ref 5.0–8.0)

## 2013-02-09 LAB — CG4 I-STAT (LACTIC ACID): Lactic Acid, Venous: 2.94 mmol/L — ABNORMAL HIGH (ref 0.5–2.2)

## 2013-02-09 LAB — ETHANOL: Alcohol, Ethyl (B): <11 mg/dL (ref 0–11)

## 2013-02-09 LAB — CBC WITH DIFFERENTIAL/PLATELET
Basophils Absolute: 0 10*3/uL (ref 0.0–0.1)
Eosinophils Absolute: 0.1 10*3/uL (ref 0.0–0.7)
Eosinophils Relative: 2 % (ref 0–5)
Lymphocytes Relative: 7 % — ABNORMAL LOW (ref 12–46)
Lymphs Abs: 0.3 10*3/uL — ABNORMAL LOW (ref 0.7–4.0)
MCV: 88.4 fL (ref 78.0–100.0)
Neutrophils Relative %: 90 % — ABNORMAL HIGH (ref 43–77)
Platelets: 210 10*3/uL (ref 150–400)
RBC: 4.57 MIL/uL (ref 4.22–5.81)
RDW: 12.8 % (ref 11.5–15.5)
WBC: 3.9 10*3/uL — ABNORMAL LOW (ref 4.0–10.5)

## 2013-02-09 LAB — URINE MICROSCOPIC-ADD ON

## 2013-02-09 LAB — ACETAMINOPHEN LEVEL: Acetaminophen (Tylenol), Serum: <15 ug/mL (ref 10–30)

## 2013-02-09 LAB — LACTIC ACID, PLASMA: Lactic Acid, Venous: 2 mmol/L (ref 0.5–2.2)

## 2013-02-09 LAB — SALICYLATE LEVEL: Salicylate Lvl: <2 mg/dL — ABNORMAL LOW (ref 2.8–20.0)

## 2013-02-09 LAB — CK: Total CK: 159 U/L (ref 7–232)

## 2013-02-09 MED ORDER — TAMSULOSIN HCL 0.4 MG PO CAPS
0.4000 mg | ORAL_CAPSULE | Freq: Every day | ORAL | Status: DC
  Filled 2013-02-09: qty 1

## 2013-02-09 MED ORDER — DARUNAVIR ETHANOLATE 800 MG PO TABS
800.0000 mg | ORAL_TABLET | Freq: Every day | ORAL | Status: DC
  Filled 2013-02-09: qty 1

## 2013-02-09 MED ORDER — SODIUM CHLORIDE 0.9 % IV SOLN
Freq: Once | INTRAVENOUS | Status: AC
  Administered 2013-02-09: 1000 mL via INTRAVENOUS

## 2013-02-09 MED ORDER — ONDANSETRON HCL 4 MG/2ML IJ SOLN: 4.0000 mg | Freq: Three times a day (TID) | INTRAMUSCULAR | Status: DC | PRN

## 2013-02-09 MED ORDER — ONDANSETRON HCL 4 MG/2ML IJ SOLN: 4.0000 mg | Freq: Four times a day (QID) | INTRAMUSCULAR | Status: AC | PRN

## 2013-02-09 MED ORDER — RITONAVIR 100 MG PO CAPS
100.0000 mg | ORAL_CAPSULE | Freq: Every day | ORAL | Status: DC
  Filled 2013-02-09: qty 1

## 2013-02-09 MED ORDER — HEPARIN SODIUM (PORCINE) 5000 UNIT/ML IJ SOLN
5000.0000 [IU] | Freq: Three times a day (TID) | INTRAMUSCULAR | Status: DC
  Administered 2013-02-09: 5000 [IU] via SUBCUTANEOUS
  Filled 2013-02-09 (×5): qty 1

## 2013-02-09 MED ORDER — EMTRICITABINE-TENOFOVIR DF 200-300 MG PO TABS: 1.0000 | ORAL_TABLET | Freq: Every day | ORAL | Status: DC

## 2013-02-09 MED ORDER — SODIUM CHLORIDE 0.9 % IV SOLN: INTRAVENOUS | Status: DC

## 2013-02-09 MED ORDER — SODIUM CHLORIDE 0.9 % IV SOLN
INTRAVENOUS | Status: DC
  Administered 2013-02-09 – 2013-02-10 (×2): via INTRAVENOUS

## 2013-02-09 MED ORDER — LORAZEPAM 2 MG/ML IJ SOLN
1.0000 mg | Freq: Four times a day (QID) | INTRAMUSCULAR | Status: DC | PRN
  Administered 2013-02-10: 1 mg via INTRAVENOUS
  Filled 2013-02-09 (×2): qty 1

## 2013-02-09 MED ORDER — LORAZEPAM 2 MG/ML IJ SOLN
2.0000 mg | Freq: Once | INTRAMUSCULAR | Status: AC
  Administered 2013-02-09: 2 mg via INTRAVENOUS
  Filled 2013-02-09: qty 1

## 2013-02-09 MED ORDER — SODIUM CHLORIDE 0.9 % IV BOLUS (SEPSIS)
1000.0000 mL | Freq: Once | INTRAVENOUS | Status: AC
  Administered 2013-02-09: 1000 mL via INTRAVENOUS

## 2013-02-09 NOTE — ED Provider Notes (Addendum)
CSN: [id number]     Arrival date & time 02/09/13  0747 History     First MD Initiated Contact with Patient 02/09/13 [phone]     Chief Complaint  Patient presents with  . Drug Problem   (Consider location/radiation/quality/duration/timing/severity/associated sxs/prior Treatment) HPI Comments: 42 y/o comes in with cc of drug use. Pt states that around 3:00 am he injected himself some substance - he thought it was ecstasy, but based on his symptoms, he thinks it might have been crystal meth. He has been using drugs x 3 days, daily, but has had ivda in the past. Currently, he has chest pain, abd discomfort, no back pain, and some spasms that are diffuse. Pt also  Patient is a 42 y.o. male presenting with drug problem. The history is provided by the patient.  Drug Problem Associated symptoms include chest pain and abdominal pain. Pertinent negatives include no headaches and no shortness of breath.    Past Medical History  Diagnosis Date  . HIV (human immunodeficiency virus infection)    History reviewed. No pertinent past surgical history. No family history on file. History  Substance Use Topics  . Smoking status: Current Every Day Smoker -- 1.00 packs/day for 20 years    Types: Cigarettes  . Smokeless tobacco: Not on file  . Alcohol Use: No    Review of Systems  Constitutional: Negative for fever, chills and activity change.  HENT: Negative for neck pain.   Eyes: Negative for visual disturbance.  Respiratory: Negative for cough, chest tightness and shortness of breath.   Cardiovascular: Positive for chest pain.  Gastrointestinal: Positive for abdominal pain. Negative for nausea, vomiting, blood in stool and abdominal distention.  Genitourinary: Negative for dysuria, enuresis and difficulty urinating.  Musculoskeletal: Positive for myalgias. Negative for arthralgias.  Neurological: Positive for weakness. Negative for dizziness, light-headedness and headaches.   Psychiatric/Behavioral: Negative for confusion.    Allergies  Dapsone and Sulfamethoxazole w-trimethoprim  Home Medications   Current Outpatient Rx  Name  Route  Sig  Dispense  Refill  . EXPIRED: citalopram (CELEXA) 20 MG tablet   Oral   Take 1 tablet (20 mg total) by mouth daily.   30 tablet   11   . darunavir (PREZISTA) 400 MG tablet   Oral   Take 2 tablets (800 mg total) by mouth daily.   60 tablet   5   . doxycycline (VIBRAMYCIN) 100 MG capsule   Oral   Take 1 capsule (100 mg total) by mouth 2 (two) times daily.   20 capsule   0   . emtricitabine-tenofovir (TRUVADA) 200-300 MG per tablet   Oral   Take 1 tablet by mouth daily.   30 tablet   5   . oxyCODONE-acetaminophen (PERCOCET/ROXICET) 5-325 MG per tablet   Oral   Take 1 tablet by mouth every 6 (six) hours as needed for pain.   15 tablet   0   . ritonavir (NORVIR) 100 MG capsule   Oral   Take 1 capsule (100 mg total) by mouth daily.   30 capsule   0   . Tamsulosin HCl (FLOMAX) 0.4 MG CAPS   Oral   Take 1 capsule (0.4 mg total) by mouth daily.   10 capsule   0   . EXPIRED: traZODone (DESYREL) 50 MG tablet   Oral   Take 1 tablet (50 mg total) by mouth at bedtime.   30 tablet   11    BP 98/53  Pulse 115  Temp(Src) 97.8 F (36.6 C) (Oral)  Resp 20  Ht 6' (1.829 m)  Wt 180 lb (81.647 kg)  BMI 24.41 kg/m2  SpO2 96% Physical Exam  Nursing note and vitals reviewed. Constitutional: He is oriented to person, place, and time. He appears well-developed.  HENT:  Head: Normocephalic and atraumatic.  Eyes: Conjunctivae and EOM are normal. Pupils are equal, round, and reactive to light.  Neck: Normal range of motion. Neck supple. No JVD present.  Cardiovascular: Normal rate, regular rhythm and intact distal pulses.   No murmur heard. Pulmonary/Chest: Effort normal and breath sounds normal.  Abdominal: Soft. Bowel sounds are normal. He exhibits no distension. There is tenderness. There is no  rebound and no guarding.  Diffuse tenderness  Neurological: He is alert and oriented to person, place, and time.  Skin: Skin is warm.    ED Course   Procedures (including critical care time)  Labs Reviewed  URINALYSIS, ROUTINE W REFLEX MICROSCOPIC  URINE RAPID DRUG SCREEN (HOSP PERFORMED)  CBC WITH DIFFERENTIAL  ACETAMINOPHEN LEVEL  COMPREHENSIVE METABOLIC PANEL  TROPONIN I  SALICYLATE LEVEL  ETHANOL  CK   No results found. No diagnosis found.  MDM   Date: 02/09/2013  Rate: 118  Rhythm: sinus tachycardia  QRS Axis: normal  Intervals: normal  ST/T Wave abnormalities: nonspecific ST/T changes  Conduction Disutrbances:incomplete RBBB  Narrative Interpretation:   Old EKG Reviewed: none available  Pt comes in with iv substance use at 3 am. He is unsure what drug he used. He is tachycardic, slightly agitated, was diaphoretic at some point, and complains of chest, abd pain. Distal pulses are intact.  We will get basic labs. We will monitor the patient closely, and constantly evaluate for end organ damage. Aortic dissection considered in the ddx with the chest and abd pain - but there is no back pain, no hx of elevated BP - and so to start off, we will just get a CXR, EKG, and troponin.    Derwood Kaplan, MD 02/09/13 0837  12:22 PM Pt somnolent, more so than he was at arrival Utox + for amphetamines. Called Poison control - they recommend supportive care only, with fluids and benzo, until back to baseline. Pt has been observed for 4 hours +, we will admit for tele monitoring and supportive care. CT head ordered. Repeat troponin and EKG ordered.  Derwood Kaplan, MD 02/09/13 1224

## 2013-02-09 NOTE — ED Notes (Signed)
MD at bedside. 

## 2013-02-09 NOTE — ED Notes (Signed)
Patient transported to CT via stretcher per tech. 

## 2013-02-09 NOTE — H&P (Signed)
Date: 02/09/2013               Patient Name:  Norman Brooks MRN: [id number]  DOB: [birth year] Age / Sex: 42 y.o., male   PCP: Randall Hiss, MD         Medical Service: Internal Medicine Teaching Service         Attending Physician: Dr. Judyann Munson, MD    First Contact: Dr. Evelena Peat Pager: [phone]  Second Contact: Dr. Lorretta Harp Pager: [phone]       After Hours (After 5p/  First Contact Pager: [phone]  weekends / holidays): Second Contact Pager: [phone]   Chief Complaint: drug overdose  History of Present Illness: Mr. Prisk is a 42 yo male who was transferred to [facility] from [facility] for possible meth overdose.  Per ED records he presented to [facility] with complaint of chest and abdominal pain and reported injecting what he thought was ecstasy around 3AM today.  UDS was positive for amphetamines.  Poison control contacted and recommended supportive care with fluids and benzos.   Upon arrival at [facility] pt was hemodynamically stable but unresponsive to verbal stimulation.  He would move in bed in response to touch but would not open his eyes or speak.  A few hours after arrival he was awake and had pulled out his IVs, gotten dressed and was ready to leave.  After some discussion with the patient about the importance of staying overnight he agreed to stay.  Meds: Current Facility-Administered Medications  Medication Dose Route Frequency Provider Last Rate Last Dose  . 0.9 %  sodium chloride infusion   Intravenous Continuous Linward Headland, MD 150 mL/hr at 02/09/13 1645    . heparin injection 5,000 Units  5,000 Units Subcutaneous Q8H Linward Headland, MD      . LORazepam (ATIVAN) injection 1 mg  1 mg Intravenous Q6H PRN Linward Headland, MD      . ondansetron [facility]) injection 4 mg  4 mg Intravenous Q6H PRN Linward Headland, MD      . Melene Muller ON 02/10/2013] tamsulosin (FLOMAX) capsule 0.4 mg  0.4 mg Oral Daily Linward Headland, MD      . [DISCONTINUED] 0.9 %  sodium chloride  infusion   Intravenous STAT Derwood Kaplan, MD        Allergies: Allergies as of 02/09/2013 - Review Complete 02/09/2013  Allergen Reaction Noted  . Dapsone  05/29/2010  . Sulfamethoxazole w-trimethoprim  05/29/2010   Past Medical History  Diagnosis Date  . HIV (human immunodeficiency virus infection)    History reviewed. No pertinent past surgical history. No family history on file. History   Social History  . Marital Status: Single    Spouse Name: N/A    Number of Children: N/A  . Years of Education: N/A   Occupational History  . Not on file.   Social History Main Topics  . Smoking status: Current Every Day Smoker -- 1.00 packs/day for 20 years    Types: Cigarettes  . Smokeless tobacco: Not on file  . Alcohol Use: No  . Drug Use: Yes     Comment: meth  . Sexually Active: Not on file     Comment: pt. declined condoms   Other Topics Concern  . Not on file   Social History Narrative  . No narrative on file    Review of Systems: Pertinent items are noted in HPI.  Physical Exam: Blood pressure 88/56,  pulse 207, temperature 98.7 F (37.1 C), temperature source Axillary, resp. rate 20, height 6' (1.829 m), weight 82.6 kg (182 lb 1.6 oz), SpO2 98.00%. General: standing up, appears agitated HEENT: pupils equal round and reactive to light, vision grossly intact, oropharynx clear and non-erythematous  Neck: supple, no lymphadenopathy Lungs: clear to ascultation bilaterally, normal work of respiration, no wheezes, rales, ronchi Heart: tachycardic, regular rhythm, no murmurs, gallops, or rubs Abdomen: soft, non-tender, non-distended, normal bowel sounds  Extremities: no cyanosis, clubbing, or edema, though multiple excoriations noted on patient's bilateral feet Neurologic: alert & oriented X3, cranial nerves II-XII intact, strength grossly intact, sensation intact to light touch  Lab results: Basic Metabolic Panel:  Recent Labs  41/32/44 0806 02/09/13 1721  NA  139 136  K 3.4* 4.0  CL 103 106  CO2 21 19  GLUCOSE 115* 110*  BUN 29* 26*  CREATININE 1.20 1.10  CALCIUM 10.4 8.7   Liver Function Tests:  Recent Labs  02/09/13 0806 02/09/13 1721  AST 34 166*  ALT 22 145*  ALKPHOS 94 70  BILITOT 0.7 0.5  PROT 7.7 6.2  ALBUMIN 4.2 3.1*   No results found for this basename: LIPASE, AMYLASE,  in the last 72 hours No results found for this basename: AMMONIA,  in the last 72 hours CBC:  Recent Labs  02/09/13 0806  WBC 3.9*  NEUTROABS 3.5  HGB 13.9  HCT 40.4  MCV 88.4  PLT 210   Cardiac Enzymes:  Recent Labs  02/09/13 0806 02/09/13 1245  CKTOTAL 159  --   TROPONINI <0.30 <0.30   BNP: No results found for this basename: PROBNP,  in the last 72 hours D-Dimer: No results found for this basename: DDIMER,  in the last 72 hours CBG:  Recent Labs  02/09/13 1259  GLUCAP 129*   Hemoglobin A1C: No results found for this basename: HGBA1C,  in the last 72 hours Fasting Lipid Panel: No results found for this basename: CHOL, HDL, LDLCALC, TRIG, CHOLHDL, LDLDIRECT,  in the last 72 hours Thyroid Function Tests: No results found for this basename: TSH, T4TOTAL, FREET4, T3FREE, THYROIDAB,  in the last 72 hours Anemia Panel: No results found for this basename: VITAMINB12, FOLATE, FERRITIN, TIBC, IRON, RETICCTPCT,  in the last 72 hours Coagulation: No results found for this basename: LABPROT, INR,  in the last 72 hours Urine Drug Screen: Drugs of Abuse     Component Value Date/Time   LABOPIA NONE DETECTED 02/09/2013 1005   COCAINSCRNUR NONE DETECTED 02/09/2013 1005   COCAINSCRNUR NEG 03/07/2011 1604   LABBENZ NONE DETECTED 02/09/2013 1005   LABBENZ NEG 03/07/2011 1604   AMPHETMU POSITIVE* 02/09/2013 1005   AMPHETMU NEG 03/07/2011 1604   THCU NONE DETECTED 02/09/2013 1005   LABBARB NONE DETECTED 02/09/2013 1005    Alcohol Level:  Recent Labs  02/09/13 0806  ETH <11   Urinalysis:  Recent Labs  02/09/13 1005  COLORURINE YELLOW    LABSPEC 1.017  PHURINE 5.5  GLUCOSEU NEGATIVE  HGBUR LARGE*  BILIRUBINUR NEGATIVE  KETONESUR NEGATIVE  PROTEINUR 100*  UROBILINOGEN 0.2  NITRITE NEGATIVE  LEUKOCYTESUR NEGATIVE    Imaging results:  Ct Head Wo Contrast  02/09/2013   *RADIOLOGY REPORT*  Clinical Data:  Drug problem.  Altered mental status.  HIV positive  CT HEAD WITHOUT CONTRAST  Technique:  Contiguous axial images were obtained from the base of the skull through the vertex without contrast  Comparison:  None.  Findings:  The brain has a normal  appearance without evidence for hemorrhage, acute infarction, hydrocephalus, or mass lesion.  There is no extra axial fluid collection.  The skull and paranasal sinuses are normal.  IMPRESSION: Normal CT of the head without contrast.   Original Report Authenticated By: Janeece Riggers, M.D.   Dg Chest Port 1 View  02/09/2013   *RADIOLOGY REPORT*  Clinical Data: Chest pain and palpitations after injection of A recreational drugs.  Smoker with current history of HIV.  PORTABLE CHEST - 1 VIEW  Comparison: Two-view chest x-ray 12/24/2012, 09/05/2010.  Findings: Near-expiratory image accounts for atelectasis at the bases and crowding of bronchovascular markings diffusely.  This also accentuates the heart size.  Taking this into account, cardiomediastinal silhouette unremarkable.  Lungs otherwise clear. No pleural effusions.  No pneumothorax.  IMPRESSION: Near-expiratory image accounts for atelectasis at the lung bases. No acute cardiopulmonary disease otherwise.   Original Report Authenticated By: Hulan Saas, M.D.    Other results: EKG: sinus tachycardia  Assessment & Plan by Problem: The patient is a 42 yo M, history of HIV, IVDA, presenting with amphetamine overdose.   # Amphetamine overdose - The patient presents after using an IV substance, with UDS positive for amphetamines, concerning for amphetamine overdose. Per poison control, treatment includes supportive care with IV fluids, and  benzos prn, for approximately 20 hours. The patient is s/p 2 L NS and 2 mg ativan at [facility].  -continue NS at 150 cc/hr  -ativan 1 mg q6hrs prn for agitation   # AKI - Cr elevated to 1.2 on admission, from 0.6 on 12/24/12. Etiology is unclear, but may represent ATN (hypotension on admission) vs prerenal (BUN/Cr ratio > 20). Unlikely post-renal (pt voiding freely) vs AIN (pt on doxycycline 6 weeks ago).  -continue IV fluids per above   # Transaminitis - AST and ALT elevated, likely due to direct effects from drug overdose vs mild shock liver (hypotension)  -repeat LFT's in AM  -continue IV fluids per above   # Anion Gap Metabolic Acidosis - Resolved. Anion gap was 15 on admission, likely representing mild lactic acidosis (lactic acid = 2.94). On repeat, on the evening of admission, gap had closed to 11, and lactic acid downtrended to 2.0.  -recheck lactic acid in AM   # HIV - CD4 count = 510 (12/24/12), VL = 67 (10/21/11). Pt states that he was non-compliant with his HIV meds for several months, but restarted 2 months ago, taking a combination of his old leftover medications and a friend's medications, which were: Prezista, Truvada, Norvir  -medications initially held due to patient unresponsiveness and difficulty ensuring patient's actual HAART regimen, but plan to restart these medications in the AM   # Prophy - Heparin  Dispo: Disposition is deferred at this time, awaiting improvement of current medical problems. Anticipated discharge in approximately 1-2 day(s).   The patient does have a current PCP Randall Hiss, MD) and does need an [facility] hospital follow-up appointment after discharge.  The patient does not have transportation limitations that hinder transportation to clinic appointments.  Signed: Evelena Peat, DO 02/09/2013, 8:50 PM

## 2013-02-09 NOTE — H&P (Signed)
HPI:  [name] is a 42 year old male with a PMHx significant for HIV (Last CD4+ count 510 on 12/24/12) and IV drug abuse who presents as a transfer from [facility] with chest pain, abdominal discomfort, and diffuse spasms. Upon transfer to floor, he is non-responsive and unable to provide a history. Per ED records, last night around 3:00 AM he injected a substance that he thought was ecstasy but could have been crystal meth. He has a history of IV drug abuse and has used drugs daily for the past three days. He denied headaches and shortness of breath.   Review of Systems  Unable to perform ROS: patient unresponsive    PMHx:  Active Medical Problems: -HIV positive -IV drug use  Allergies:  -Dapsone --> Rash -Sulfamethoxazole with trimethoprim --> Unknown (patient unresponsive)  Medications: Per medical records: No current facility-administered medications on file prior to encounter.   Current Outpatient Prescriptions on File Prior to Encounter  Medication Sig Dispense Refill   citalopram (CELEXA) 20 MG tablet Take 1 tablet (20 mg total) by mouth daily.  30 tablet  11   darunavir (PREZISTA) 400 MG tablet Take 2 tablets (800 mg total) by mouth daily.  60 tablet  5   doxycycline (VIBRAMYCIN) 100 MG capsule Take 1 capsule (100 mg total) by mouth 2 (two) times daily.  20 capsule  0   emtricitabine-tenofovir (TRUVADA) 200-300 MG per tablet Take 1 tablet by mouth daily.  30 tablet  5   oxyCODONE-acetaminophen (PERCOCET/ROXICET) 5-325 MG per tablet Take 1 tablet by mouth every 6 (six) hours as needed for pain.  15 tablet  0   ritonavir (NORVIR) 100 MG capsule Take 1 capsule (100 mg total) by mouth daily.  30 capsule  0   Tamsulosin HCl (FLOMAX) 0.4 MG CAPS Take 1 capsule (0.4 mg total) by mouth daily.  10 capsule  0   traZODone (DESYREL) 50 MG tablet Take 1 tablet (50 mg total) by mouth at bedtime.  30 tablet  11   Current facility-administered  medications:0.9 %  sodium chloride infusion, , Intravenous, STAT, Ankit Nanavati, MD;  ondansetron (ZOFRAN) injection 4 mg, 4 mg, Intravenous, Q8H PRN, Derwood Kaplan, MD  Family History: No family history on file. Patient unresponsive.  Social History: Per medical records, pt unresponsive History   Social History   Marital Status: Single    Spouse Name: N/A    Number of Children: N/A   Years of Education: N/A   Occupational History   Not on file.   Social History Main Topics   Smoking status: Current Every Day Smoker -- 1.00 packs/day for 20 years    Types: Cigarettes   Smokeless tobacco: Not on file   Alcohol Use: No   Drug Use: Yes     Comment: meth   Sexually Active: Not on file     Comment: pt. declined condoms   Other Topics Concern   Not on file   Social History Narrative   No narrative on file    Vital Signs: Blood pressure 115/63, pulse 123, temperature 97.3 F (36.3 C), temperature source Oral, resp. rate 21, height 6' (1.829 m), weight 81.647 kg (180 lb), SpO2 96.00%.  Physical Exam  Vitals reviewed. Constitutional:  Lying on his side in the hospital bed, grunted in response to our questions   HENT:  Head: Normocephalic and atraumatic.  Eyes:  Patient would not let us open his eyes for exam  Cardiovascular: Regular rhythm  and intact distal pulses.  Exam reveals no gallop and no friction rub.   No murmur heard. Tachycardic  Respiratory: Breath sounds normal. No respiratory distress.  Only able to assess left lower lobe as patient would not move or breathe deeply  GI: Soft. Bowel sounds are normal.  Neurological:  Non-responsive to verbal commands. Moved in response to physical exam stimuli. Would not let us open mouth or eyelids. Somnolent.   Skin: Skin is warm and dry.  Scattered small excoriations on lower extremities     Labs: CBC    Component Value Date/Time   WBC 3.9* 02/09/2013 0806   RBC 4.57 02/09/2013 0806   HGB 13.9 02/09/2013  0806   HCT 40.4 02/09/2013 0806   PLT 210 02/09/2013 0806   MCV 88.4 02/09/2013 0806   MCH 30.4 02/09/2013 0806   MCHC 34.4 02/09/2013 0806   RDW 12.8 02/09/2013 0806   LYMPHSABS 0.3* 02/09/2013 0806   MONOABS 0.0* 02/09/2013 0806   EOSABS 0.1 02/09/2013 0806   BASOSABS 0.0 02/09/2013 0806   Differential: 90% neutrophils 7% lymphocytes 1% monocytes 2% eosinophils  CMP     Component Value Date/Time   NA 139 02/09/2013 0806   K 3.4* 02/09/2013 0806   CL 103 02/09/2013 0806   CO2 21 02/09/2013 0806   GLUCOSE 115* 02/09/2013 0806   BUN 29* 02/09/2013 0806   CREATININE 1.20 02/09/2013 0806   CREATININE 0.73 10/21/2011 0950   CALCIUM 10.4 02/09/2013 0806   PROT 7.7 02/09/2013 0806   ALBUMIN 4.2 02/09/2013 0806   AST 34 02/09/2013 0806   ALT 22 02/09/2013 0806   ALKPHOS 94 02/09/2013 0806   BILITOT 0.7 02/09/2013 0806   GFRNONAA 75* 02/09/2013 0806   GFRAA 87* 02/09/2013 0806   Troponin <0.34  (x 2)  Salicylates < 2.0 Acetaminophen < 15.0  Lactic Acid 2.94*  Urinalysis    Component Value Date/Time   COLORURINE YELLOW 02/09/2013 1005   APPEARANCEUR CLOUDY* 02/09/2013 1005   LABSPEC 1.017 02/09/2013 1005   PHURINE 5.5 02/09/2013 1005   GLUCOSEU NEGATIVE 02/09/2013 1005   HGBUR LARGE* 02/09/2013 1005   HGBUR moderate 05/28/2010 1144   BILIRUBINUR NEGATIVE 02/09/2013 1005   BILIRUBINUR NEGATIVE 10/21/2011 1034   KETONESUR NEGATIVE 02/09/2013 1005   PROTEINUR 100* 02/09/2013 1005   UROBILINOGEN 0.2 02/09/2013 1005   UROBILINOGEN 0.2 10/21/2011 1034   NITRITE NEGATIVE 02/09/2013 1005   NITRITE NEGATIVE 10/21/2011 1034   LEUKOCYTESUR NEGATIVE 02/09/2013 1005  Hyaline and granular casts present.  Drugs of Abuse     Component Value Date/Time   LABOPIA NONE DETECTED 02/09/2013 1005   COCAINSCRNUR NONE DETECTED 02/09/2013 1005   COCAINSCRNUR NEG 03/07/2011 1604   LABBENZ NONE DETECTED 02/09/2013 1005   LABBENZ NEG 03/07/2011 1604   AMPHETMU POSITIVE* 02/09/2013 1005   AMPHETMU NEG 03/07/2011 1604   THCU NONE DETECTED 02/09/2013 1005   LABBARB NONE  DETECTED 02/09/2013 1005     HIV Viral Load (10/21/11): 67 CD4+ T cells (12/24/12): 510 CD4 Percentage (12/24/12): 34%  Studies:  EKG (performed at ED) Date: 02/09/2013  Rate: 118  Rhythm: sinus tachycardia  QRS Axis: normal  Intervals: normal  ST/T Wave abnormalities: nonspecific ST/T changes  Conduction Disutrbances:incomplete RBBB  Narrative Interpretation:  Old EKG Reviewed: none available  CXR 02/09/13 IMPRESSION:  Near-expiratory image accounts for atelectasis at the lung bases.  No acute cardiopulmonary disease otherwise.  Original Report Authenticated By: Hulan Saas, M.D.  Head CT 02/09/13 IMPRESSION:  Normal CT of the head  without contrast.  Original Report Authenticated By: Janeece Riggers, M.D.   Assessment/Plan: [name] is a 42 year old male with a PMHx significant for HIV and IV drug abuse who presents as a transfer from [facility] with chest pain, abdominal discomfort, and diffuse spasms in the setting of recent IV methamphetamine use.    **Chest Pain/Abdominal Pain/Tachycardia/AMS The differential for chest pain is broad and includes methamphetamine intoxication, ACS, pneumonia, pneumothorax, aortic dissection, pericarditis/myocarditis, and PE. We were unable to get an accurate history to further clarify his chest pain this afternoon. We will revisit with the patient and ask for further clarification later. His troponin levels have been negative x 2 and his EKG showed diffuse ST/T changes. Given the negative cardiac enzymes and the patient's relatively young age MI is less likely. The chest x-ray results make pneumonia and pneumothorax unlikely causes of chest pain. Patient is tachycardic however he has no other evidence of DVT and his Pulmonary Embolism Wells Score is 1.5 (low probability).   Given his history of recent methamphetamine injection, positive urine drug screen for methamphetamines, abdominal pain, and persistent tachycardia on  exam, it is probable that methamphetamine intoxication is causing his chest pain, abdominal pain, and altered mental status. The stimulant effects of methamphetamine could potentially be causing demand ischemia on the patient's heart leading to his chest pain. Methamphetamine use can also cause delirium and paranoia which in the context of recent history, is a probable cause for the patient's AMS. Head CT was normal which makes an acute bleed a less likely source of mental status change. He was also given lorazepam in the ED which could account for some of his somnolence. He is not hypoglycemic today.  -finish cycling cardiac enzymes -repeat detailed H&P once patient is alert and responsive  **Amphetamine Intoxication Given the possibility for patients using methamphetamine to become agitated and pose a danger to themselves and others, prioritize control of agitation using IV benzodiazepines when necessary. He has a low blood pressure and is tachycardic, so continue fluid resuscitation with normal saline. His temperature is within normal limits, however monitor for any hyperthermia. Serum acetaminophen and salicylate levels were checked and are within normal limits. Lactic acid is 2.94, so there is probable metabolic acidosis.  - If his condition does not improve, consider ABG to further evaluate metabolic acidosis  **AKI  Patient's creatinine is elevated to 1.20 from 0.60 on 12/24/12. BUN is also elevated at 29 from 10 on 12/24/12. He also had an abnormal urinalysis. The AKI is likely due to methamphetamine intoxication. Although CK total today is 159 and K is 3.4, given the AKI, metabolic acidosis, positive urine dipstick, and amphetamine use we must be careful to monitor for rhabdomyolysis. He also had a bump in LFT's. When combined with the low BP on admission, the AKI and LFT elevations could be explained by ATN in the kidneys and shock liver. The nurse taking care of him tells me he has not been  urinating. -Continue to replete fluids to prevent any effects from pre-renal azotemia due to hypovolemia -Monitor I/O with condom catheter -Bladder scan   **HIV/Leukopenia/IVDA The patient has a history of HIV. Per pharmacy, patient has not been to ID clinic recently and has not filled his prescriptions for antiretroviral medications.  -restart antiretroviral therapy to prevent HAART resistance.  -follow CBC with differential -monitor for signs of infection -check to see if he is sharing needles  -sexual history  Merrie Roof 02/09/2013,  1:33 PM

## 2013-02-09 NOTE — ED Notes (Signed)
Pts roommate updated per pt request via telephone.

## 2013-02-09 NOTE — Progress Notes (Signed)
02/09/13 Pt arrived from [facility]  With Dx of Substance abuse. Pt is very lethargic, IV in LT wrist with NS at 150cc/hr,02 at 2L/[place],Tele in place running ST, Lives at home with roommate. Call placed to Internal Medicine to inform of arrival. Currently NPO, skin intact but dirty feet.

## 2013-02-09 NOTE — ED Notes (Signed)
Pt reports did some drugs about 0300, possibly crystal meth or other, pt reports unsure, shortly thereafter with diffuse body pain, lightheadedness and jitters.

## 2013-02-10 ENCOUNTER — Emergency Department (HOSPITAL_COMMUNITY): Payer: BC Managed Care – PPO

## 2013-02-10 ENCOUNTER — Encounter (HOSPITAL_COMMUNITY): Payer: Self-pay | Admitting: Emergency Medicine

## 2013-02-10 ENCOUNTER — Observation Stay (HOSPITAL_COMMUNITY)
Admission: EM | Admit: 2013-02-10 | Discharge: 2013-02-11 | Discharge status: Against Medical Advice | Payer: BC Managed Care – PPO | Attending: Internal Medicine | Admitting: Internal Medicine

## 2013-02-10 ENCOUNTER — Other Ambulatory Visit: Payer: Self-pay

## 2013-02-10 DIAGNOSIS — T43624A Poisoning by amphetamines, undetermined, initial encounter: Secondary | ICD-10-CM | POA: Insufficient documentation

## 2013-02-10 DIAGNOSIS — Z87442 Personal history of urinary calculi: Secondary | ICD-10-CM | POA: Insufficient documentation

## 2013-02-10 DIAGNOSIS — E876 Hypokalemia: Secondary | ICD-10-CM | POA: Insufficient documentation

## 2013-02-10 DIAGNOSIS — Z21 Asymptomatic human immunodeficiency virus [HIV] infection status: Secondary | ICD-10-CM | POA: Insufficient documentation

## 2013-02-10 DIAGNOSIS — F1910 Other psychoactive substance abuse, uncomplicated: Secondary | ICD-10-CM | POA: Insufficient documentation

## 2013-02-10 DIAGNOSIS — R000 Tachycardia, unspecified: Secondary | ICD-10-CM | POA: Insufficient documentation

## 2013-02-10 DIAGNOSIS — D696 Thrombocytopenia, unspecified: Secondary | ICD-10-CM | POA: Insufficient documentation

## 2013-02-10 DIAGNOSIS — T43601A Poisoning by unspecified psychostimulants, accidental (unintentional), initial encounter: Secondary | ICD-10-CM | POA: Insufficient documentation

## 2013-02-10 DIAGNOSIS — F322 Major depressive disorder, single episode, severe without psychotic features: Secondary | ICD-10-CM

## 2013-02-10 DIAGNOSIS — R310 Gross hematuria: Secondary | ICD-10-CM | POA: Insufficient documentation

## 2013-02-10 DIAGNOSIS — N179 Acute kidney failure, unspecified: Secondary | ICD-10-CM | POA: Insufficient documentation

## 2013-02-10 DIAGNOSIS — F329 Major depressive disorder, single episode, unspecified: Secondary | ICD-10-CM

## 2013-02-10 DIAGNOSIS — T43621A Poisoning by amphetamines, accidental (unintentional), initial encounter: Secondary | ICD-10-CM

## 2013-02-10 DIAGNOSIS — R109 Unspecified abdominal pain: Principal | ICD-10-CM | POA: Insufficient documentation

## 2013-02-10 DIAGNOSIS — B20 Human immunodeficiency virus [HIV] disease: Secondary | ICD-10-CM | POA: Diagnosis present

## 2013-02-10 DIAGNOSIS — N390 Urinary tract infection, site not specified: Secondary | ICD-10-CM

## 2013-02-10 LAB — COMPREHENSIVE METABOLIC PANEL
ALT: 107 U/L — ABNORMAL HIGH (ref 0–53)
ALT: 93 U/L — ABNORMAL HIGH (ref 0–53)
AST: 72 U/L — ABNORMAL HIGH (ref 0–37)
AST: 56 U/L — ABNORMAL HIGH (ref 0–37)
Albumin: 2.8 g/dL — ABNORMAL LOW (ref 3.5–5.2)
CO2: 22 mEq/L (ref 19–32)
CO2: 22 mEq/L (ref 19–32)
Calcium: 8.6 mg/dL (ref 8.4–10.5)
Chloride: 103 mEq/L (ref 96–112)
Creatinine, Ser: 1.06 mg/dL (ref 0.50–1.35)
GFR calc Af Amer: >90 mL/min (ref >90)
GFR calc non Af Amer: 87 mL/min — ABNORMAL LOW (ref >90)
GFR calc non Af Amer: >90 mL/min (ref >90)
Glucose, Bld: 133 mg/dL — ABNORMAL HIGH (ref 70–99)
Sodium: 137 mEq/L (ref 135–145)
Sodium: 133 mEq/L — ABNORMAL LOW (ref 135–145)
Total Bilirubin: 0.4 mg/dL (ref 0.3–1.2)
Total Protein: 6.1 g/dL (ref 6.0–8.3)

## 2013-02-10 LAB — URINE CULTURE
Colony Count: NO GROWTH
Culture: NO GROWTH

## 2013-02-10 LAB — CBC
Hemoglobin: 11.9 g/dL — ABNORMAL LOW (ref 13.0–17.0)
MCH: 30.2 pg (ref 26.0–34.0)
MCH: 30.1 pg (ref 26.0–34.0)
MCHC: 34.5 g/dL (ref 30.0–36.0)
MCHC: 33.9 g/dL (ref 30.0–36.0)
MCV: 88.6 fL (ref 78.0–100.0)
Platelets: 171 10*3/uL (ref 150–400)
Platelets: 116 10*3/uL — ABNORMAL LOW (ref 150–400)
RBC: 3.87 MIL/uL — ABNORMAL LOW (ref 4.22–5.81)
RDW: 13.6 % (ref 11.5–15.5)

## 2013-02-10 LAB — URINALYSIS, ROUTINE W REFLEX MICROSCOPIC
Glucose, UA: NEGATIVE mg/dL
Specific Gravity, Urine: 1.017 (ref 1.005–1.030)
pH: 5 (ref 5.0–8.0)

## 2013-02-10 LAB — URINE MICROSCOPIC-ADD ON

## 2013-02-10 LAB — CK
Total CK: 61 U/L (ref 7–232)
Total CK: 68 U/L (ref 7–232)

## 2013-02-10 LAB — RAPID URINE DRUG SCREEN, HOSP PERFORMED: Benzodiazepines: NOT DETECTED

## 2013-02-10 MED ORDER — LORAZEPAM 2 MG/ML IJ SOLN
1.0000 mg | Freq: Four times a day (QID) | INTRAMUSCULAR | Status: DC | PRN
  Filled 2013-02-10 (×4): qty 1

## 2013-02-10 MED ORDER — POTASSIUM CHLORIDE 10 MEQ/100ML IV SOLN
10.0000 meq | INTRAVENOUS | Status: AC
  Administered 2013-02-11: 10 meq via INTRAVENOUS
  Filled 2013-02-10 (×4): qty 100

## 2013-02-10 MED ORDER — SODIUM CHLORIDE 0.9 % IJ SOLN: 3.0000 mL | Freq: Two times a day (BID) | INTRAMUSCULAR | Status: DC

## 2013-02-10 MED ORDER — VANCOMYCIN HCL IN DEXTROSE 1-5 GM/200ML-% IV SOLN
1000.0000 mg | Freq: Three times a day (TID) | INTRAVENOUS | Status: DC
  Administered 2013-02-11: 1000 mg via INTRAVENOUS
  Filled 2013-02-10 (×5): qty 200

## 2013-02-10 MED ORDER — SODIUM CHLORIDE 0.9 % IV SOLN
Freq: Once | INTRAVENOUS | Status: AC
  Administered 2013-02-10: 14:00:00 via INTRAVENOUS

## 2013-02-10 MED ORDER — LORAZEPAM 2 MG/ML IJ SOLN
1.0000 mg | Freq: Once | INTRAMUSCULAR | Status: AC
  Administered 2013-02-10: 1 mg via INTRAVENOUS
  Filled 2013-02-10: qty 1

## 2013-02-10 MED ORDER — POTASSIUM CHLORIDE CRYS ER 20 MEQ PO TBCR: 40.0000 meq | EXTENDED_RELEASE_TABLET | Freq: Once | ORAL | Status: DC

## 2013-02-10 MED ORDER — PIPERACILLIN-TAZOBACTAM 3.375 G IVPB 30 MIN
3.3750 g | Freq: Once | INTRAVENOUS | Status: AC
  Administered 2013-02-10: 3.375 g via INTRAVENOUS
  Filled 2013-02-10: qty 50

## 2013-02-10 MED ORDER — SODIUM CHLORIDE 0.9 % IV SOLN: 1000.0000 mL | Freq: Once | INTRAVENOUS | Status: DC

## 2013-02-10 MED ORDER — SODIUM CHLORIDE 0.9 % IV SOLN
INTRAVENOUS | Status: DC
  Administered 2013-02-11: 1000 mL via INTRAVENOUS

## 2013-02-10 MED ORDER — PIPERACILLIN-TAZOBACTAM 3.375 G IVPB
3.3750 g | Freq: Three times a day (TID) | INTRAVENOUS | Status: DC
  Administered 2013-02-11: 3.375 g via INTRAVENOUS
  Filled 2013-02-10 (×4): qty 50

## 2013-02-10 MED ORDER — VANCOMYCIN HCL IN DEXTROSE 1-5 GM/200ML-% IV SOLN
1000.0000 mg | Freq: Once | INTRAVENOUS | Status: AC
  Administered 2013-02-10: 1000 mg via INTRAVENOUS
  Filled 2013-02-10: qty 200

## 2013-02-10 MED ORDER — HEPARIN SODIUM (PORCINE) 5000 UNIT/ML IJ SOLN
5000.0000 [IU] | Freq: Three times a day (TID) | INTRAMUSCULAR | Status: DC
  Administered 2013-02-11: 5000 [IU] via SUBCUTANEOUS
  Filled 2013-02-10 (×6): qty 1

## 2013-02-10 NOTE — ED Notes (Signed)
Pt alert, arrives from [place] via EMS, pt c/o "liver pain after using Meth 45 min ago", resp even unlabored, skin pwd

## 2013-02-10 NOTE — ED Notes (Signed)
[id number]<AV> Expected date:<BR> Expected time:<BR> Means of arrival:<BR> Comments:<BR> ems - did crystal meth

## 2013-02-10 NOTE — ED Notes (Signed)
Called to give report nurse uanvailable will call back.

## 2013-02-10 NOTE — Discharge Summary (Signed)
  Date: 02/10/2013  Patient name: Norman Brooks  Medical record number: [id number]  Date of birth: 1970/11/08   This patient has been seen and the plan of care was discussed with the house staff. Please see their note for complete details. I concur with their findings with the following additions/corrections:  Patient felt back to his baseline after < 24hr admission from his methamphetamine overdose. He expressed following up at [facility], however he was confused to the name of the clinic. We mentioned that he needed to reschedule his appt and follow up in the next 1-2 wk in order to get ADAP paperwork started so that he can get access to HIV medications. He is currently using study drugs that he previously was non compliant, DRVr/truvada. Also recommended to use safe injection practices/harm reduction in the setting of his illicit drug use.  Judyann Munson, MD 02/10/2013, 3:40 PM

## 2013-02-10 NOTE — ED Notes (Signed)
Norman Brooks (mother) [phone]

## 2013-02-10 NOTE — H&P (Signed)
Date: 02/11/2013               Patient Name:  Norman Brooks MRN: [id number]  DOB: Jan 23, 1971 Age / Sex: 42 y.o., male   PCP: Randall Hiss, MD         Medical Service: Internal Medicine Teaching Service         Attending Physician: Dr. Judyann Munson, MD    First Contact: Dr. Evelena Peat Pager: [phone]  Second Contact: Dr. Lorretta Harp Pager: [phone]       After Hours (After 5p/  First Contact Pager: [phone]  weekends / holidays): Second Contact Pager: [phone]   Chief Complaint: Drug overdose and flank pain  History of Present Illness: [name] is a 42 year old male with a PMHx significant for HIV (Last CD4+ count 510 on 12/24/12), IV drug abuse, kidney stones who presents as a transfer from [facility] for acute complaints of drug overdose and flank pain.  The patient was admitted to our service the day before yesterday (on 02/09/2013) with similar complaints. UDS was positive for amphetamines. Poison control was contacted, and they recommended supportive care with fluids and benzodiazepines. He was found to have transaminitis and AKI on lab workup.   Patient left AMA this morning (on 02/10/13) around 0500, because, according to his fiance, he was agitated from coming down off of the methamphetamine and felt he was not getting enough Ativan. The patient left and went to a friend's house to get high via IV crystal meth. Afterwards he had immediate BL flank pain, body aches, racing heart, nausea. He also now complains of dark colored urine. Of note, the patient was off of his HIV antiviral medications for several months and just recently began taking them again.  Upon presentation to the emergency department, the patient was noted to be tachycardic (130s-140s), tachypneic (RR 26-31), and leukopenic (WBC 2.3). Since SIRS criteria were met with these parameters, the patient was given 1L normal saline IV fluids and started on antibiotics (vancomycin and zosyn). Other  notable laboratory studies included UDS positive for amphetamine, thrombocytopenia (116), mild transaminitis improved from prior studies, hypokalemia (K=3.1). Urinalysis was notable for large Hgb on dipstick and too numerous to count red blood cells per high-power field on microscopy. CK and lactate levels were within normal limits. A CT of the head was performed, which was normal. Chest x-ray showed no acute cardiopulmonary disease. Noncontrast CT was negative for renal calculi or obstructive process.    Meds: Current Facility-Administered Medications  Medication Dose Route Frequency Provider Last Rate Last Dose  . 0.9 %  sodium chloride infusion  1,000 mL Intravenous Once Arthor Captain, PA-C      . 0.9 %  sodium chloride infusion   Intravenous Continuous Genelle Gather, MD      . heparin injection 5,000 Units  5,000 Units Subcutaneous Q8H Genelle Gather, MD      . LORazepam (ATIVAN) injection 1 mg  1 mg Intravenous Q6H PRN Genelle Gather, MD      . piperacillin-tazobactam (ZOSYN) IVPB 3.375 g  3.375 g Intravenous Q8H Thuyvan Stacey Drain, RPH      . potassium chloride 10 mEq in 100 mL IVPB  10 mEq Intravenous Q1 Hr x 4 Genelle Gather, MD   10 mEq at 02/11/13 0142  . sodium chloride 0.9 % injection 3 mL  3 mL Intravenous Q12H Genelle Gather, MD      . vancomycin [facility]) IVPB  1000 mg/200 mL premix  1,000 mg Intravenous Q8H Thuyvan Stacey Drain, RPH        Allergies: Allergies as of 02/10/2013 - Review Complete 02/10/2013  Allergen Reaction Noted  . Dapsone Rash 05/29/2010  . Sulfamethoxazole w-trimethoprim Rash 05/29/2010   Past Medical History  Diagnosis Date  . HIV (human immunodeficiency virus infection)   . Depression   . Anxiety    Past Surgical History  Procedure Laterality Date  . Hernia repair Left ` 2007   No family history on file. History   Social History  . Marital Status: Single    Spouse Name: N/A    Number of Children: N/A  . Years of Education: N/A    Occupational History  . Not on file.   Social History Main Topics  . Smoking status: Current Every Day Smoker -- 1.00 packs/day for 25 years    Types: Cigarettes  . Smokeless tobacco: Never Used  . Alcohol Use: No  . Drug Use: Yes    Special: Methamphetamines  . Sexually Active: Yes     Comment: pt. declined condoms   Other Topics Concern  . Not on file   Social History Narrative  . No narrative on file    Review of Systems: Pertinent items are noted in HPI. Ten point ROS performed.  Physical Exam: Blood pressure 126/79, pulse 103, temperature 98.1 F (36.7 C), temperature source Oral, resp. rate 20, height 6' 0.01" (1.829 m), weight 184 lb 1.4 oz (83.5 kg), SpO2 98.00%. Physical Exam  Constitutional: He is well-developed, well-nourished, and in no distress. No distress.  Seen s/p Ativan administration. Patient somnolent.  HENT:  Head: Normocephalic and atraumatic.  Eyes: Pupils are equal, round, and reactive to light.  Cardiovascular: Normal rate, regular rhythm, normal heart sounds and intact distal pulses.  Exam reveals no gallop and no friction rub.   No murmur heard. Mild tachycardia noted (PR ~100).  Pulmonary/Chest: Effort normal and breath sounds normal. No respiratory distress. He has no wheezes. He has no rales. He exhibits no tenderness.  Abdominal: Soft. Bowel sounds are normal. He exhibits no distension. There is no tenderness. There is no CVA tenderness.  Skin: Skin is warm and dry. He is not diaphoretic.     Lab results: Basic Metabolic Panel:  Recent Labs  47/82/95 0603 02/10/13 1311  NA 137 133*  K 3.7 3.1*  CL 107 103  CO2 22 22  GLUCOSE 133* 117*  BUN 25* 18  CREATININE 1.06 0.96  CALCIUM 8.6 8.6   Liver Function Tests:  Recent Labs  02/10/13 0603 02/10/13 1311  AST 72* 56*  ALT 107* 93*  ALKPHOS 80 160*  BILITOT 0.3 0.4  PROT 6.1 6.0  ALBUMIN 2.7* 2.8*   No results found for this basename: LIPASE, AMYLASE,  in the last 72  hours No results found for this basename: AMMONIA,  in the last 72 hours CBC:  Recent Labs  02/09/13 0806 02/10/13 0603 02/10/13 1311  WBC 3.9* 26.9* 2.3*  NEUTROABS 3.5  --   --   HGB 13.9 11.7* 11.9*  HCT 40.4 33.9* 35.1*  MCV 88.4 87.6 88.6  PLT 210 171 116*   Cardiac Enzymes:  Recent Labs  02/09/13 0806 02/09/13 1245 02/10/13 0603 02/10/13 1409  CKTOTAL 159  --  61 68  TROPONINI <0.30 <0.30  --   --    CBG:  Recent Labs  02/09/13 1259  GLUCAP 129*   Urine Drug Screen: Drugs of Abuse  Component Value Date/Time   LABOPIA NONE DETECTED 02/10/2013 1403   COCAINSCRNUR NONE DETECTED 02/10/2013 1403   COCAINSCRNUR NEG 03/07/2011 1604   LABBENZ NONE DETECTED 02/10/2013 1403   LABBENZ NEG 03/07/2011 1604   AMPHETMU POSITIVE* 02/10/2013 1403   AMPHETMU NEG 03/07/2011 1604   THCU NONE DETECTED 02/10/2013 1403   LABBARB NONE DETECTED 02/10/2013 1403    Alcohol Level:  Recent Labs  02/09/13 0806  ETH <11   Urinalysis:  Recent Labs  02/09/13 1005 02/10/13 1403  COLORURINE YELLOW RED*  LABSPEC 1.017 1.017  PHURINE 5.5 5.0  GLUCOSEU NEGATIVE NEGATIVE  HGBUR LARGE* LARGE*  BILIRUBINUR NEGATIVE NEGATIVE  KETONESUR NEGATIVE NEGATIVE  PROTEINUR 100* 100*  UROBILINOGEN 0.2 0.2  NITRITE NEGATIVE NEGATIVE  LEUKOCYTESUR NEGATIVE SMALL*   Misc. Labs: Lactic Acid, Venous  Date Value Range Status  02/10/2013 1.56  0.5 - 2.2 mmol/L Final  02/10/2013 1.8  0.5 - 2.2 mmol/L Final  02/09/2013 2.0  0.5 - 2.2 mmol/L Final  02/09/2013 2.94* 0.5 - 2.2 mmol/L Final   Total CK  Date Value Range Status  02/10/2013 68  7 - 232 U/L Final  02/10/2013 61  7 - 232 U/L Final    Imaging results:  Ct Abdomen Pelvis Wo Contrast  02/10/2013   *RADIOLOGY REPORT*  Clinical Data: Abdominal pain  CT ABDOMEN AND PELVIS WITHOUT CONTRAST  Technique:  Multidetector CT imaging of the abdomen and pelvis was performed following the standard protocol without intravenous contrast.  Comparison: 01/23/2012   Findings: The lung bases demonstrate some mild dependent atelectatic changes. A tiny left-sided pleural effusion is seen.  The liver, gallbladder, spleen, adrenal glands and pancreas are normal in their CT appearance.  Kidneys are well visualized bilaterally and reveal no renal calculi or obstructive changes.  The appendix is not well visualized although no inflammatory changes are noted to suggest appendicitis.  The bladder is partially distended.  Multiple phleboliths are seen.  No pelvic mass lesion or side wall abnormality is noted.  IMPRESSION: Left-sided pleural effusion and bibasilar atelectasis.  No other focal abnormality is seen.   Original Report Authenticated By: Alcide Clever, M.D.   Ct Head Wo Contrast  02/09/2013   *RADIOLOGY REPORT*  Clinical Data:  Drug problem.  Altered mental status.  HIV positive  CT HEAD WITHOUT CONTRAST  Technique:  Contiguous axial images were obtained from the base of the skull through the vertex without contrast  Comparison:  None.  Findings:  The brain has a normal appearance without evidence for hemorrhage, acute infarction, hydrocephalus, or mass lesion.  There is no extra axial fluid collection.  The skull and paranasal sinuses are normal.  IMPRESSION: Normal CT of the head without contrast.   Original Report Authenticated By: Janeece Riggers, M.D.   Dg Chest Port 1 View  02/10/2013   *RADIOLOGY REPORT*  Clinical Data: HIV, abdominal pain  PORTABLE CHEST - 1 VIEW  Comparison: Prior chest x-ray 02/09/2013  Findings: Persistent very low inspiratory volumes with increasing pulmonary vascular congestion now bordering on mild interstitial edema.  Stable cardiac and mediastinal contours.  Relatively gasless upper abdomen.  No acute osseous abnormality.  No pneumothorax or large effusion.  IMPRESSION:  1.  Increasing pulmonary vascular congestion now with borderline interstitial edema. 2.  Persistent very low inspiratory volumes.   Original Report Authenticated By: Malachy Moan, M.D.   Dg Chest Port 1 View  02/09/2013   *RADIOLOGY REPORT*  Clinical Data: Chest pain and palpitations after injection of A recreational  drugs.  Smoker with current history of HIV.  PORTABLE CHEST - 1 VIEW  Comparison: Two-view chest x-ray 12/24/2012, 09/05/2010.  Findings: Near-expiratory image accounts for atelectasis at the bases and crowding of bronchovascular markings diffusely.  This also accentuates the heart size.  Taking this into account, cardiomediastinal silhouette unremarkable.  Lungs otherwise clear. No pleural effusions.  No pneumothorax.  IMPRESSION: Near-expiratory image accounts for atelectasis at the lung bases. No acute cardiopulmonary disease otherwise.   Original Report Authenticated By: Hulan Saas, M.D.    Other results: EKG: unchanged from previous tracings, sinus tachycardia.  Assessment & Plan by Problem: [name] is a 42 year old male with a PMHx significant for HIV (Last CD4+ count 510 on 12/24/12), IV drug abuse, kidney stones who presents as a transfer from [facility] for acute complaints of drug overdose and flank pain, found again to have a positive UDS for amphetamines, and hematuria.  # Amphetamine overdose sequalae - UDS positive for amphetamines. Per poison control, treatment includes supportive care with IV fluids, and benzos prn, for approximately 20 hours. - Continue NS at 128ml/hr  - Ativan 1 mg q6hrs prn for agitation   #SIRS - Tachycardic, tachypnic on presentation to [facility] ED with WBC<4, meeting SIRS criteria. However tachycardia and tachypnea could both be 2/2 his recent drug use as well. - 1L NS in ED - Continuing Zosyn and vancomycin started in ED - Follow up blood cultures x2  # Hematuria - No evidence of renal calculi on CT. Patient with positive urine dipstick for Hgb as far back as 2013. However urine today is grossly bloody. CK WNL x2, making rhabdomyolysis less likely. Per [employer] hematuria is a known  adverse effect of darunavir in less than 2% of patients. Also could be a sequelae of methamphetamine use. UTI is another possibility. - Follow up urine culture  # AKI - BUN/Cr now 18/0.96. - Appears to have resolved after receiving IVF   # Hypokalemia - 3.1 - KCl po given  # Transaminitis - AST and ALT mildly elevated but improved from last admission, likely due to direct effects from drug overdose vs mild shock liver (hypotension)  - Repeat LFT's in AM  - Continue IV fluids per above   # HIV - CD4 count = 510 (12/24/12), VL = 67 (10/21/11). Unclear compliance with home regimen. - Continue darunavir, truvada, ritonavir  # DVT PPX - subq heparin and SCDs  Dispo: Disposition is deferred at this time, awaiting improvement of current medical problems. Anticipated discharge in approximately 1-3 day(s).   The patient does have a current PCP Randall Hiss, MD) and does need an [facility] hospital follow-up appointment after discharge.  The patient does not have transportation limitations that hinder transportation to clinic appointments.  Signed: Vivi Barrack, MD 02/11/2013, 1:53 AM

## 2013-02-10 NOTE — ED Notes (Signed)
[facility] notified of pt's transfer to [facility]. 

## 2013-02-10 NOTE — ED Provider Notes (Signed)
Patient signed out to me by Tiburcio Pea, PA-C.  Plan is to admit the patient following CT scan to rule out stone.  Results for orders placed during the hospital encounter of 02/10/13  URINE RAPID DRUG SCREEN (HOSP PERFORMED)      Result Value Range   Opiates NONE DETECTED  NONE DETECTED   Cocaine NONE DETECTED  NONE DETECTED   Benzodiazepines NONE DETECTED  NONE DETECTED   Amphetamines POSITIVE (*) NONE DETECTED   Tetrahydrocannabinol NONE DETECTED  NONE DETECTED   Barbiturates NONE DETECTED  NONE DETECTED  URINALYSIS, ROUTINE W REFLEX MICROSCOPIC      Result Value Range   Color, Urine RED (*) YELLOW   APPearance TURBID (*) CLEAR   Specific Gravity, Urine 1.017  1.005 - 1.030   pH 5.0  5.0 - 8.0   Glucose, UA NEGATIVE  NEGATIVE mg/dL   Hgb urine dipstick LARGE (*) NEGATIVE   Bilirubin Urine NEGATIVE  NEGATIVE   Ketones, ur NEGATIVE  NEGATIVE mg/dL   Protein, ur 147 (*) NEGATIVE mg/dL   Urobilinogen, UA 0.2  0.0 - 1.0 mg/dL   Nitrite NEGATIVE  NEGATIVE   Leukocytes, UA SMALL (*) NEGATIVE  COMPREHENSIVE METABOLIC PANEL      Result Value Range   Sodium 133 (*) 135 - 145 mEq/L   Potassium 3.1 (*) 3.5 - 5.1 mEq/L   Chloride 103  96 - 112 mEq/L   CO2 22  19 - 32 mEq/L   Glucose, Bld 117 (*) 70 - 99 mg/dL   BUN 18  6 - 23 mg/dL   Creatinine, Ser 8.29  0.50 - 1.35 mg/dL   Calcium 8.6  8.4 - 56.2 mg/dL   Total Protein 6.0  6.0 - 8.3 g/dL   Albumin 2.8 (*) 3.5 - 5.2 g/dL   AST 56 (*) 0 - 37 U/L   ALT 93 (*) 0 - 53 U/L   Alkaline Phosphatase 160 (*) 39 - 117 U/L   Total Bilirubin 0.4  0.3 - 1.2 mg/dL   GFR calc non Af Amer >90  >90 mL/min   GFR calc Af Amer >90  >90 mL/min  CBC      Result Value Range   WBC 2.3 (*) 4.0 - 10.5 K/uL   RBC 3.96 (*) 4.22 - 5.81 MIL/uL   Hemoglobin 11.9 (*) 13.0 - 17.0 g/dL   HCT 13.0 (*) 86.5 - 78.4 %   MCV 88.6  78.0 - 100.0 fL   MCH 30.1  26.0 - 34.0 pg   MCHC 33.9  30.0 - 36.0 g/dL   RDW 69.6  29.5 - 28.4 %   Platelets 116 (*) 150 - 400 K/uL   CK      Result Value Range   Total CK 68  7 - 232 U/L  URINE MICROSCOPIC-ADD ON      Result Value Range   WBC, UA 7-10  <3 WBC/hpf   RBC / HPF TOO NUMEROUS TO COUNT  <3 RBC/hpf   Bacteria, UA FEW (*) RARE   Urine-Other MANY YEAST    CG4 I-STAT (LACTIC ACID)      Result Value Range   Lactic Acid, Venous 1.56  0.5 - 2.2 mmol/L   Ct Abdomen Pelvis Wo Contrast  02/10/2013   *RADIOLOGY REPORT*  Clinical Data: Abdominal pain  CT ABDOMEN AND PELVIS WITHOUT CONTRAST  Technique:  Multidetector CT imaging of the abdomen and pelvis was performed following the standard protocol without intravenous contrast.  Comparison: 01/23/2012  Findings: The lung  bases demonstrate some mild dependent atelectatic changes. A tiny left-sided pleural effusion is seen.  The liver, gallbladder, spleen, adrenal glands and pancreas are normal in their CT appearance.  Kidneys are well visualized bilaterally and reveal no renal calculi or obstructive changes.  The appendix is not well visualized although no inflammatory changes are noted to suggest appendicitis.  The bladder is partially distended.  Multiple phleboliths are seen.  No pelvic mass lesion or side wall abnormality is noted.  IMPRESSION: Left-sided pleural effusion and bibasilar atelectasis.  No other focal abnormality is seen.   Original Report Authenticated By: Alcide Clever, M.D.   Ct Head Wo Contrast  02/09/2013   *RADIOLOGY REPORT*  Clinical Data:  Drug problem.  Altered mental status.  HIV positive  CT HEAD WITHOUT CONTRAST  Technique:  Contiguous axial images were obtained from the base of the skull through the vertex without contrast  Comparison:  None.  Findings:  The brain has a normal appearance without evidence for hemorrhage, acute infarction, hydrocephalus, or mass lesion.  There is no extra axial fluid collection.  The skull and paranasal sinuses are normal.  IMPRESSION: Normal CT of the head without contrast.   Original Report Authenticated By: Janeece Riggers,  M.D.   Dg Chest Port 1 View  02/10/2013   *RADIOLOGY REPORT*  Clinical Data: HIV, abdominal pain  PORTABLE CHEST - 1 VIEW  Comparison: Prior chest x-ray 02/09/2013  Findings: Persistent very low inspiratory volumes with increasing pulmonary vascular congestion now bordering on mild interstitial edema.  Stable cardiac and mediastinal contours.  Relatively gasless upper abdomen.  No acute osseous abnormality.  No pneumothorax or large effusion.  IMPRESSION:  1.  Increasing pulmonary vascular congestion now with borderline interstitial edema. 2.  Persistent very low inspiratory volumes.   Original Report Authenticated By: Malachy Moan, M.D.   Dg Chest Port 1 View  02/09/2013   *RADIOLOGY REPORT*  Clinical Data: Chest pain and palpitations after injection of A recreational drugs.  Smoker with current history of HIV.  PORTABLE CHEST - 1 VIEW  Comparison: Two-view chest x-ray 12/24/2012, 09/05/2010.  Findings: Near-expiratory image accounts for atelectasis at the bases and crowding of bronchovascular markings diffusely.  This also accentuates the heart size.  Taking this into account, cardiomediastinal silhouette unremarkable.  Lungs otherwise clear. No pleural effusions.  No pneumothorax.  IMPRESSION: Near-expiratory image accounts for atelectasis at the lung bases. No acute cardiopulmonary disease otherwise.   Original Report Authenticated By: Hulan Saas, M.D.    7:43 PM No evidence of stone.  Discussed the patient with Dr. Effie Shy, and IM residents.  Will admit the patient to [facility].  Patient's vitals have improved.  Current HR is 116.  Roxy Horseman, PA-C 02/10/13 1944

## 2013-02-10 NOTE — Progress Notes (Signed)
Pt attempting to leave AMA. On call MD notified & came to see pt. After talking with the MD pt agreed to stay. Will continue to monitor pt.

## 2013-02-10 NOTE — Clinical Social Work Note (Signed)
CSW unable to address social work consult for amphetamine O/D. Patient left AMA before CSW was able to assess. CSW signing off. Roddie Mc, [place], [place], [phone]

## 2013-02-10 NOTE — ED Provider Notes (Signed)
Medical screening examination/treatment/procedure(s) were performed by non-physician practitioner and as supervising physician I was immediately available for consultation/collaboration.  Sahil Milner L Junell Cullifer, MD 02/10/13 2212 

## 2013-02-10 NOTE — Progress Notes (Signed)
ANTIBIOTIC CONSULT NOTE - INITIAL  Pharmacy Consult for Vancomycin, Zosyn Indication: sepsis  Allergies  Allergen Reactions  . Dapsone Rash  . Sulfamethoxazole W-Trimethoprim Rash    Patient Measurements: Height: 6' 0.05" (183 cm) Weight: 179 lb 14.3 oz (81.6 kg) IBW/kg (Calculated) : 77.71  Vital Signs: Temp: 99 F (37.2 C) (08/06 1612) Temp src: Oral (08/06 1612) BP: 119/77 mmHg (08/06 1612) Pulse Rate: 129 (08/06 1612) Intake/Output from previous day:   Intake/Output from this shift:    Labs:  Recent Labs  02/09/13 0806 02/09/13 1721 02/10/13 0603 02/10/13 1311  WBC 3.9*  --  26.9* 2.3*  HGB 13.9  --  11.7* 11.9*  PLT 210  --  171 116*  CREATININE 1.20 1.10 1.06 0.96   Estimated Creatinine Clearance: 113.5 ml/min (by C-G formula based on Cr of 0.96). No results found for this basename: Rolm Gala, VANCORANDOM, GENTTROUGH, GENTPEAK, GENTRANDOM, TOBRATROUGH, TOBRAPEAK, TOBRARND, AMIKACINPEAK, AMIKACINTROU, AMIKACIN,  in the last 72 hours   Microbiology: Recent Results (from the past 720 hour(s))  URINE CULTURE     Status: None   Collection Time    02/09/13 10:05 AM      Result Value Range Status   Specimen Description URINE, CLEAN CATCH   Final   Special Requests NONE   Final   Culture  Setup Time     Final   Value: 02/09/2013 15:14     Performed at [facility] Count     Final   Value: NO GROWTH     Performed at [facility]   Culture     Final   Value: NO GROWTH     Performed at [facility]   Report Status 02/10/2013 FINAL   Final    Medical History: Past Medical History  Diagnosis Date  . HIV (human immunodeficiency virus infection)   . Depression   . Anxiety      Assessment:  45 yom with h/o HIV left AMA 8/6AM from [facility].  Patient was hospitalized for amphetamine overdose, polysubstance abuse.  Patient returned to ED 8/6PM with c/o kidney pain s/p methamphetamine use. Pt found with tachycardia,  tactile fever, UA with small leuks - MD concerned for meth overdose, rhabdomyolysis, UTI and sepsis. Vanc 1g and Zosyn 3.375 gm IV x 1 ordered in the ED.  MD order to continue Vanc and Zosyn per pharmacy protocol.   Afebrile, WBC 2.3K, Scr 0.96 for CG CrCl and normalized CrCl > 100 ml/min.   Blood and urine cultures pending  Goal of Therapy:  Vancomycin trough level 15-20 mcg/ml Appropriate renal dosing of Zosyn  Plan:   Vancomycin 1gm IV q8h  Zosyn 3.375 gm IV q8h extended infusion over 4 hours  Pharmacy will f/u  Geoffry Paradise, PharmD, BCPS Pager: [phone] 5:05 PM Pharmacy #: [id number]

## 2013-02-10 NOTE — Progress Notes (Signed)
Per pt's girlfriend/fiance, pt is allergic to opiates but unable to specify which ones.

## 2013-02-10 NOTE — Progress Notes (Addendum)
MD notified of  patient arrival to  Room 6e 20

## 2013-02-10 NOTE — ED Notes (Signed)
[facility] here to transfer pt to [facility]. 

## 2013-02-10 NOTE — H&P (Signed)
Date: 02/10/2013  Patient name: Norman Brooks  Medical record number: [id number]  Date of birth: June 21, 1971   I have seen and evaluated [name] and discussed their care with the Residency Team.  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 He was given IVF and ativan overnight and appears back to baseline health < 24hrs.  Physical Exam: Blood pressure 98/57, pulse 109, temperature 98.4 F (36.9 C), temperature source Oral, resp. rate 16, height 6' (1.829 m), weight 182 lb 1.6 oz (82.6 kg), SpO2 98.00%. Physical Exam  Constitutional: He is oriented to person, place, and time. He appears well-developed and well-nourished. No distress.  HENT:  Mouth/Throat: Oropharynx is clear and moist. No oropharyngeal exudate.  Cardiovascular: Normal rate, regular rhythm and normal heart sounds. Exam reveals no gallop and no friction rub.  No murmur heard.  Pulmonary/Chest: Effort normal and breath sounds normal. No respiratory distress. He has no wheezes.  Abdominal: Soft. Bowel sounds are normal. He exhibits no distension. There is no tenderness.  Lymphadenopathy:  He has no cervical adenopathy.  Neurological: He is alert and oriented to person, place, and time.  Skin: Skin is warm and dry. No rash noted. No erythema.  Psychiatric: He has a normal mood and affect. His behavior is normal.    Lab results: Results for orders placed during the hospital encounter of 02/09/13 (from the past 24 hour(s))  LACTIC ACID, PLASMA     Status: None   Collection Time    02/09/13  5:20 PM      Result Value Range   Lactic Acid, Venous 2.0  0.5 - 2.2 mmol/L  COMPREHENSIVE METABOLIC PANEL     Status: Abnormal   Collection Time    02/09/13  5:21 PM      Result Value Range   Sodium 136  135 - 145 mEq/L   Potassium 4.0  3.5 - 5.1 mEq/L   Chloride 106  96 - 112 mEq/L   CO2 19  19 - 32 mEq/L   Glucose, Bld 110 (*)  70 - 99 mg/dL   BUN 26 (*) 6 - 23 mg/dL   Creatinine, Ser 1.19  0.50 - 1.35 mg/dL   Calcium 8.7  8.4 - 14.7 mg/dL   Total Protein 6.2  6.0 - 8.3 g/dL   Albumin 3.1 (*) 3.5 - 5.2 g/dL   AST 829 (*) 0 - 37 U/L   ALT 145 (*) 0 - 53 U/L   Alkaline Phosphatase 70  39 - 117 U/L   Total Bilirubin 0.5  0.3 - 1.2 mg/dL   GFR calc non Af Amer 83 (*) >90 mL/min   GFR calc Af Amer >90  >90 mL/min  LACTIC ACID, PLASMA     Status: None   Collection Time    02/10/13  5:00 AM      Result Value Range   Lactic Acid, Venous 1.8  0.5 - 2.2 mmol/L  COMPREHENSIVE METABOLIC PANEL     Status: Abnormal   Collection Time    02/10/13  6:03 AM      Result Value Range   Sodium 137  135 - 145 mEq/L   Potassium 3.7  3.5 - 5.1 mEq/L   Chloride 107  96 - 112 mEq/L   CO2 22  19 - 32 mEq/L   Glucose, Bld 133 (*) 70 - 99 mg/dL   BUN 25 (*) 6 - 23 mg/dL   Creatinine,  Ser 1.06  0.50 - 1.35 mg/dL   Calcium 8.6  8.4 - 16.1 mg/dL   Total Protein 6.1  6.0 - 8.3 g/dL   Albumin 2.7 (*) 3.5 - 5.2 g/dL   AST 72 (*) 0 - 37 U/L   ALT 107 (*) 0 - 53 U/L   Alkaline Phosphatase 80  39 - 117 U/L   Total Bilirubin 0.3  0.3 - 1.2 mg/dL   GFR calc non Af Amer 87 (*) >90 mL/min   GFR calc Af Amer >90  >90 mL/min  CBC     Status: Abnormal   Collection Time    02/10/13  6:03 AM      Result Value Range   WBC 26.9 (*) 4.0 - 10.5 K/uL   RBC 3.87 (*) 4.22 - 5.81 MIL/uL   Hemoglobin 11.7 (*) 13.0 - 17.0 g/dL   HCT 09.6 (*) 04.5 - 40.9 %   MCV 87.6  78.0 - 100.0 fL   MCH 30.2  26.0 - 34.0 pg   MCHC 34.5  30.0 - 36.0 g/dL   RDW 81.1  91.4 - 78.2 %   Platelets 171  150 - 400 K/uL  CK     Status: None   Collection Time    02/10/13  6:03 AM      Result Value Range   Total CK 61  7 - 232 U/L    Imaging results:  Ct Head Wo Contrast  02/09/2013   *RADIOLOGY REPORT*  Clinical Data:  Drug problem.  Altered mental status.  HIV positive  CT HEAD WITHOUT CONTRAST  Technique:  Contiguous axial images were obtained from the base of  the skull through the vertex without contrast  Comparison:  None.  Findings:  The brain has a normal appearance without evidence for hemorrhage, acute infarction, hydrocephalus, or mass lesion.  There is no extra axial fluid collection.  The skull and paranasal sinuses are normal.  IMPRESSION: Normal CT of the head without contrast.   Original Report Authenticated By: Janeece Riggers, M.D.   Dg Chest Port 1 View  02/10/2013   *RADIOLOGY REPORT*  Clinical Data: HIV, abdominal pain  PORTABLE CHEST - 1 VIEW  Comparison: Prior chest x-ray 02/09/2013  Findings: Persistent very low inspiratory volumes with increasing pulmonary vascular congestion now bordering on mild interstitial edema.  Stable cardiac and mediastinal contours.  Relatively gasless upper abdomen.  No acute osseous abnormality.  No pneumothorax or large effusion.  IMPRESSION:  1.  Increasing pulmonary vascular congestion now with borderline interstitial edema. 2.  Persistent very low inspiratory volumes.   Original Report Authenticated By: Malachy Moan, M.D.   Dg Chest Port 1 View  02/09/2013   *RADIOLOGY REPORT*  Clinical Data: Chest pain and palpitations after injection of A recreational drugs.  Smoker with current history of HIV.  PORTABLE CHEST - 1 VIEW  Comparison: Two-view chest x-ray 12/24/2012, 09/05/2010.  Findings: Near-expiratory image accounts for atelectasis at the bases and crowding of bronchovascular markings diffusely.  This also accentuates the heart size.  Taking this into account, cardiomediastinal silhouette unremarkable.  Lungs otherwise clear. No pleural effusions.  No pneumothorax.  IMPRESSION: Near-expiratory image accounts for atelectasis at the lung bases. No acute cardiopulmonary disease otherwise.   Original Report Authenticated By: Hulan Saas, M.D.    Assessment and Plan: I have seen and evaluated the patient as outlined above. I agree with the formulated Assessment and Plan as detailed in the residents' admission  note, with the following changes:  Agree with plan as outlined by Dr. Andrey Campanile. We will discuss more with the patient to get him back into care. He does not care to continue at [facility] in [place] and would like to establish care at [facility]. Gave harm reduction recommendations if he continues to use IVDU  Judyann Munson, MD 8/6/20144:19 PM

## 2013-02-10 NOTE — ED Provider Notes (Signed)
CSN: [id number]     Arrival date & time 02/10/13  1221 History     First MD Initiated Contact with Patient 02/10/13 1228     Chief Complaint  Patient presents with  . Drug Problem  . Abdominal Pain   (Consider location/radiation/quality/duration/timing/severity/associated sxs/prior Treatment) HPI  Norman Brooks is a(n) 42 y.o. male who presents  To the emergency department with c/o drug overdose and kidney pain. Patient has a pmh of HIV, polysubstance abuse and kidney stones. The patient was seen here yesterday with te same complaints and found do have transaminitis and AKI. Patient left AMA this morning around 0500, because, according to his girlfriend, he was agitated from coming down off of the methamphetamine and couldn't get his ativan. The patient left and went home to get high and had immediate BL flank pain, body aches, racing heart, nausea. He now c/o dark colored urine. The patient was off of his antiviral medications for several months and just recently began taking them again. He does not know what his CD4 counts or viral loads are.  Past Medical History  Diagnosis Date  . HIV (human immunodeficiency virus infection)   . Depression   . Anxiety    Past Surgical History  Procedure Laterality Date  . Hernia repair Left ` 2007   No family history on file. History  Substance Use Topics  . Smoking status: Current Every Day Smoker -- 1.00 packs/day for 25 years    Types: Cigarettes  . Smokeless tobacco: Never Used  . Alcohol Use: No    Review of Systems  Constitutional: Positive for fever and chills.  HENT: Negative for trouble swallowing and neck stiffness.   Eyes: Negative for visual disturbance.  Respiratory: Negative for cough, shortness of breath and wheezing.   Cardiovascular: Positive for palpitations.  Gastrointestinal: Positive for nausea. Negative for vomiting.  Genitourinary: Positive for flank pain.  Musculoskeletal: Positive for myalgias, back pain and  arthralgias.  Skin: Negative for rash and wound.  Neurological: Positive for dizziness and weakness. Negative for light-headedness.  All other systems reviewed and are negative.    Allergies  Dapsone and Sulfamethoxazole w-trimethoprim  Home Medications   Current Outpatient Rx  Name  Route  Sig  Dispense  Refill  . darunavir (PREZISTA) 400 MG tablet   Oral   Take 2 tablets (800 mg total) by mouth daily.   60 tablet   5   . emtricitabine-tenofovir (TRUVADA) 200-300 MG per tablet   Oral   Take 1 tablet by mouth daily.   30 tablet   5   . LORazepam (ATIVAN) 1 MG tablet   Oral   Take 2 mg by mouth every 8 (eight) hours.         . ritonavir (NORVIR) 100 MG capsule   Oral   Take 1 capsule (100 mg total) by mouth daily.   30 capsule   0    BP 123/72  Pulse 141  Temp(Src) 99.5 F (37.5 C) (Oral)  Resp 28  Wt 180 lb (81.647 kg)  BMI 24.41 kg/m2  SpO2 95% Physical Exam  Nursing note and vitals reviewed. Constitutional: He appears well-developed and well-nourished.  HENT:  Head: Normocephalic and atraumatic.  Eyes: Conjunctivae are normal. No scleral icterus.  Neck: Normal range of motion. Neck supple.  Cardiovascular: Regular rhythm, normal heart sounds and intact distal pulses.   tachycardic  Pulmonary/Chest: Effort normal and breath sounds normal. No respiratory distress.  Abdominal: Soft. There is no tenderness.  No  cva tenderness  Musculoskeletal: Normal range of motion. He exhibits no edema.  Neurological: He is alert.  Skin: Skin is warm and dry.  Hot and flushed. No signs of abscess or infection  Psychiatric: His behavior is normal.    ED Course   Procedures (including critical care time)  Labs Reviewed  URINE RAPID DRUG SCREEN (HOSP PERFORMED) - Abnormal; Notable for the following:    Amphetamines POSITIVE (*)    All other components within normal limits  URINALYSIS, ROUTINE W REFLEX MICROSCOPIC - Abnormal; Notable for the following:    Color,  Urine RED (*)    APPearance TURBID (*)    Hgb urine dipstick LARGE (*)    Protein, ur 100 (*)    Leukocytes, UA SMALL (*)    All other components within normal limits  COMPREHENSIVE METABOLIC PANEL - Abnormal; Notable for the following:    Sodium 133 (*)    Potassium 3.1 (*)    Glucose, Bld 117 (*)    Albumin 2.8 (*)    AST 56 (*)    ALT 93 (*)    Alkaline Phosphatase 160 (*)    All other components within normal limits  CBC - Abnormal; Notable for the following:    WBC 2.3 (*)    RBC 3.96 (*)    Hemoglobin 11.9 (*)    HCT 35.1 (*)    Platelets 116 (*)    All other components within normal limits  URINE MICROSCOPIC-ADD ON - Abnormal; Notable for the following:    Bacteria, UA FEW (*)    All other components within normal limits  CULTURE, BLOOD (ROUTINE X 2)  CULTURE, BLOOD (ROUTINE X 2)  URINE CULTURE  CK  CG4 I-STAT (LACTIC ACID)   Ct Head Wo Contrast  02/09/2013   *RADIOLOGY REPORT*  Clinical Data:  Drug problem.  Altered mental status.  HIV positive  CT HEAD WITHOUT CONTRAST  Technique:  Contiguous axial images were obtained from the base of the skull through the vertex without contrast  Comparison:  None.  Findings:  The brain has a normal appearance without evidence for hemorrhage, acute infarction, hydrocephalus, or mass lesion.  There is no extra axial fluid collection.  The skull and paranasal sinuses are normal.  IMPRESSION: Normal CT of the head without contrast.   Original Report Authenticated By: Janeece Riggers, M.D.   Dg Chest Port 1 View  02/09/2013   *RADIOLOGY REPORT*  Clinical Data: Chest pain and palpitations after injection of A recreational drugs.  Smoker with current history of HIV.  PORTABLE CHEST - 1 VIEW  Comparison: Two-view chest x-ray 12/24/2012, 09/05/2010.  Findings: Near-expiratory image accounts for atelectasis at the bases and crowding of bronchovascular markings diffusely.  This also accentuates the heart size.  Taking this into account,  cardiomediastinal silhouette unremarkable.  Lungs otherwise clear. No pleural effusions.  No pneumothorax.  IMPRESSION: Near-expiratory image accounts for atelectasis at the lung bases. No acute cardiopulmonary disease otherwise.   Original Report Authenticated By: Hulan Saas, M.D.   No diagnosis found.   Date: 02/10/2013  Rate: 135   Rhythm: sinus tachycardia  QRS Axis: normal  Intervals: normal  ST/T Wave abnormalities: normal  Conduction Disutrbances:incomplete RBBB  Narrative Interpretation:   Old EKG Reviewed: unchanged except for tachycardic from Dec 24, 2012   MDM  1:45 PM Patient returns after leaving AMA to shoot up with meth at home. Concern for Meth overdose, UTI, Rhabdomyolysis, and sepsis. Labs pending .  2:30 PM Patient with  White count of 2.3, BUN and creatinine have normalized.  Tachycardic with tactile fever.i have ordered rectal temp. Concern for sepsis as he has many risk factors and meets criteria.   3:30 PM Patient labs positive for amphetamines. Urine+ for blood, white cells, yeast, and WBCs Thrombocytopenic Concern for kidney stones. CXr concerning for worsening Pulmonary vascular congestion and interstitial edema, likely secondary to high output heart failure. Filed Vitals:   02/10/13 1330 02/10/13 1400 02/10/13 1430 02/10/13 1500  BP: 121/69 123/72 109/73 118/81  Pulse: 143 141 136 131  Temp:      TempSrc:      Resp: 30 28 23 27   Weight:      SpO2: 97% 95% 97% 97%   I have begun treatment for sepsis with vanc/zosyn. I have given report to PA [facility] who will assume care of the patient.    Arthor Captain, PA-C 02/10/13 1734

## 2013-02-10 NOTE — Discharge Summary (Signed)
**Patient left AMA**    Name: Norman Brooks MRN: [id number] DOB: 11-21-1970 42 y.o. PCP: Randall Hiss, MD  Date of Admission: 02/09/2013  7:51 AM Date of Discharge: 02/10/2013 Attending Physician: Dr. Judyann Munson  Discharge Diagnosis: 1. Amphetamine overdose - IV overdose, presented with agitation, left AMA 2. Polysubstance abuse  3. Acute Kidney Injury - likely prerenal 4. HIV - CD4 = 510 (12/24/12)  Discharge Medications: Medication Reconciliation not performed, as patient left AMA  Disposition and follow-up:   The patient left AMA before follow-up appointments could be made.  He expressed interest in establishing with the ID clinic in [facility], as he stated he did not want to return to [facility].  Consultations: Poison Control Hotline  Procedures Performed:  Ct Head Wo Contrast  02/09/2013   *RADIOLOGY REPORT*  Clinical Data:  Drug problem.  Altered mental status.  HIV positive  CT HEAD WITHOUT CONTRAST  Technique:  Contiguous axial images were obtained from the base of the skull through the vertex without contrast  Comparison:  None.  Findings:  The brain has a normal appearance without evidence for hemorrhage, acute infarction, hydrocephalus, or mass lesion.  There is no extra axial fluid collection.  The skull and paranasal sinuses are normal.  IMPRESSION: Normal CT of the head without contrast.   Original Report Authenticated By: Janeece Riggers, M.D.   Dg Chest Port 1 View  02/09/2013   *RADIOLOGY REPORT*  Clinical Data: Chest pain and palpitations after injection of A recreational drugs.  Smoker with current history of HIV.  PORTABLE CHEST - 1 VIEW  Comparison: Two-view chest x-ray 12/24/2012, 09/05/2010.  Findings: Near-expiratory image accounts for atelectasis at the bases and crowding of bronchovascular markings diffusely.  This also accentuates the heart size.  Taking this into account, cardiomediastinal silhouette unremarkable.  Lungs otherwise clear. No pleural effusions.   No pneumothorax.  IMPRESSION: Near-expiratory image accounts for atelectasis at the lung bases. No acute cardiopulmonary disease otherwise.   Original Report Authenticated By: Hulan Saas, M.D.   Admission HPI:  Mr. Sudol is a 42 yo male who was transferred to [facility] from [facility] for possible meth overdose. Per ED records he presented to [facility] with complaint of chest and abdominal pain and reported injecting what he thought was ecstasy around 3AM today. UDS was positive for amphetamines. Poison control contacted and recommended supportive care with fluids and benzos.  Upon arrival at [facility] pt was hemodynamically stable but unresponsive to verbal stimulation. He would move in bed in response to touch but would not open his eyes or speak. A few hours after arrival he was awake and had pulled out his IVs, gotten dressed and was ready to leave. After some discussion with the patient about the importance of staying overnight he agreed to stay.  Hospital Course by problem list: 1. Amphetamine overdose - The patient presented to the ED with agitation, stomach pain, and chest pain.  UDS was positive for amphetamines.  Poison control was contacted, and recommended supportive care with IV fluids and benzodiazepines prn, which we performed.  Labs were significant for an AKI (see below) and mild transaminitis, both of which were improving at the time of discharge.  We counseled the patient on drug cessation, as well as safe practices (ie avoiding needle sharing, avoiding using drugs alone, etc.).  The patient left AMA on the morning after admission.  2. Acute Kidney Injury - The patient presented with a Cr = 1.2, elevated  from 0.6 two months prior.  BUN:Cr ratio > 20, indicating likely prerenal etiology.  Creatinine improved with IV fluids to 1.06 by the time of discharge.  3. HIV - The patient has a history of HIV.  He notes a period of several months to a year of non-compliance with  medications, but recently restarted medications 2 months ago.  He has been taking norvir, prezista, and truvada, through a combination of the patient's own collection of HIV medications from 1 year ago, and buying some HIV medications from a friend.  His last CD4 = 510 (12/24/12).  We checked a viral load and genotype, as well as HCV Ab, and planned to call the [facility] ID clinic (per pt preference) to reschedule his recently missed appointment, but the patient left AMA before arrangements could be made.  Discharge Vitals:   BP 98/57  Pulse 109  Temp(Src) 98.4 F (36.9 C) (Oral)  Resp 16  Ht 6' (1.829 m)  Wt 182 lb 1.6 oz (82.6 kg)  BMI 24.69 kg/m2  SpO2 98%  Discharge Labs:  Results for orders placed during the hospital encounter of 02/09/13 (from the past 24 hour(s))  LACTIC ACID, PLASMA     Status: None   Collection Time    02/09/13  5:20 PM      Result Value Range   Lactic Acid, Venous 2.0  0.5 - 2.2 mmol/L  COMPREHENSIVE METABOLIC PANEL     Status: Abnormal   Collection Time    02/09/13  5:21 PM      Result Value Range   Sodium 136  135 - 145 mEq/L   Potassium 4.0  3.5 - 5.1 mEq/L   Chloride 106  96 - 112 mEq/L   CO2 19  19 - 32 mEq/L   Glucose, Bld 110 (*) 70 - 99 mg/dL   BUN 26 (*) 6 - 23 mg/dL   Creatinine, Ser 1.61  0.50 - 1.35 mg/dL   Calcium 8.7  8.4 - 09.6 mg/dL   Total Protein 6.2  6.0 - 8.3 g/dL   Albumin 3.1 (*) 3.5 - 5.2 g/dL   AST 045 (*) 0 - 37 U/L   ALT 145 (*) 0 - 53 U/L   Alkaline Phosphatase 70  39 - 117 U/L   Total Bilirubin 0.5  0.3 - 1.2 mg/dL   GFR calc non Af Amer 83 (*) >90 mL/min   GFR calc Af Amer >90  >90 mL/min  LACTIC ACID, PLASMA     Status: None   Collection Time    02/10/13  5:00 AM      Result Value Range   Lactic Acid, Venous 1.8  0.5 - 2.2 mmol/L  COMPREHENSIVE METABOLIC PANEL     Status: Abnormal   Collection Time    02/10/13  6:03 AM      Result Value Range   Sodium 137  135 - 145 mEq/L   Potassium 3.7  3.5 - 5.1 mEq/L    Chloride 107  96 - 112 mEq/L   CO2 22  19 - 32 mEq/L   Glucose, Bld 133 (*) 70 - 99 mg/dL   BUN 25 (*) 6 - 23 mg/dL   Creatinine, Ser 4.09  0.50 - 1.35 mg/dL   Calcium 8.6  8.4 - 81.1 mg/dL   Total Protein 6.1  6.0 - 8.3 g/dL   Albumin 2.7 (*) 3.5 - 5.2 g/dL   AST 72 (*) 0 - 37 U/L   ALT 107 (*) 0 -  53 U/L   Alkaline Phosphatase 80  39 - 117 U/L   Total Bilirubin 0.3  0.3 - 1.2 mg/dL   GFR calc non Af Amer 87 (*) >90 mL/min   GFR calc Af Amer >90  >90 mL/min  CBC     Status: Abnormal   Collection Time    02/10/13  6:03 AM      Result Value Range   WBC 26.9 (*) 4.0 - 10.5 K/uL   RBC 3.87 (*) 4.22 - 5.81 MIL/uL   Hemoglobin 11.7 (*) 13.0 - 17.0 g/dL   HCT 29.5 (*) 62.1 - 30.8 %   MCV 87.6  78.0 - 100.0 fL   MCH 30.2  26.0 - 34.0 pg   MCHC 34.5  30.0 - 36.0 g/dL   RDW 65.7  84.6 - 96.2 %   Platelets 171  150 - 400 K/uL  CK     Status: None   Collection Time    02/10/13  6:03 AM      Result Value Range   Total CK 61  7 - 232 U/L    Signed: Linward Headland, MD 02/10/2013, 3:13 PM   Time Spent on Discharge: 45 minutes Services Ordered on Discharge: None Equipment Ordered on Discharge: None

## 2013-02-10 NOTE — Care Management Note (Signed)
    Page 1 of 1   02/10/2013     10:01:00 AM   CARE MANAGEMENT NOTE 02/10/2013  Patient:  Norman Brooks, Norman Brooks   Account Number:  [id number]  Date Initiated:  02/10/2013  Documentation initiated by:  Letha Cape  Subjective/Objective Assessment:   dx amphetamine od  admit as observation.  pta indep.     Action/Plan:   Anticipated DC Date:  02/10/2013   Anticipated DC Plan:  HOME/SELF CARE      DC Planning Services  CM consult      Choice offered to / List presented to:             Status of service:  Completed, signed off Medicare Important Message given?   (If response is "NO", the following Medicare IM given date fields will be blank) Date Medicare IM given:   Date Additional Medicare IM given:    Discharge Disposition:  AGAINST MEDICAL ADVICE  Per UR Regulation:  Reviewed for med. necessity/level of care/duration of stay  If discussed at Long Length of Stay Meetings, dates discussed:    Comments:  02/10/13 9:59 Letha Cape RN, BSN [phone] patient left AMA.

## 2013-02-10 NOTE — Progress Notes (Signed)
Pt left ama refuse to sign ama papers. MD notified.

## 2013-02-10 NOTE — ED Notes (Signed)
[id number]<AV> Expected date:<BR> Expected time:<BR> Means of arrival:<BR> Comments:<BR> ems- 42 year old, change in mental status, decrease in mobility

## 2013-02-11 ENCOUNTER — Encounter (HOSPITAL_COMMUNITY): Payer: Self-pay | Admitting: General Practice

## 2013-02-11 DIAGNOSIS — B20 Human immunodeficiency virus [HIV] disease: Secondary | ICD-10-CM

## 2013-02-11 DIAGNOSIS — F329 Major depressive disorder, single episode, unspecified: Secondary | ICD-10-CM

## 2013-02-11 DIAGNOSIS — Z5189 Encounter for other specified aftercare: Secondary | ICD-10-CM

## 2013-02-11 LAB — DIFFERENTIAL
Basophils Relative: 0 % (ref 0–1)
Eosinophils Absolute: 0.8 10*3/uL — ABNORMAL HIGH (ref 0.0–0.7)
Lymphs Abs: 2.2 10*3/uL (ref 0.7–4.0)
Monocytes Absolute: 1.6 10*3/uL — ABNORMAL HIGH (ref 0.1–1.0)

## 2013-02-11 LAB — COMPREHENSIVE METABOLIC PANEL
Alkaline Phosphatase: 145 U/L — ABNORMAL HIGH (ref 39–117)
BUN: 12 mg/dL (ref 6–23)
Chloride: 111 mEq/L (ref 96–112)
Creatinine, Ser: 1.08 mg/dL (ref 0.50–1.35)
GFR calc Af Amer: >90 mL/min (ref >90)
Glucose, Bld: 113 mg/dL — ABNORMAL HIGH (ref 70–99)
Potassium: 4.5 mEq/L (ref 3.5–5.1)
Total Bilirubin: 0.4 mg/dL (ref 0.3–1.2)
Total Protein: 5.6 g/dL — ABNORMAL LOW (ref 6.0–8.3)

## 2013-02-11 LAB — URINALYSIS, ROUTINE W REFLEX MICROSCOPIC
Nitrite: NEGATIVE
Specific Gravity, Urine: 1.008 (ref 1.005–1.030)
Urobilinogen, UA: 0.2 mg/dL (ref 0.0–1.0)

## 2013-02-11 LAB — CBC
Platelets: 127 10*3/uL — ABNORMAL LOW (ref 150–400)
RBC: 3.82 MIL/uL — ABNORMAL LOW (ref 4.22–5.81)
RDW: 13.9 % (ref 11.5–15.5)
WBC: 21.5 10*3/uL — ABNORMAL HIGH (ref 4.0–10.5)

## 2013-02-11 LAB — URINE CULTURE

## 2013-02-11 LAB — URINE MICROSCOPIC-ADD ON

## 2013-02-11 MED ORDER — LORAZEPAM 2 MG/ML IJ SOLN
1.0000 mg | Freq: Four times a day (QID) | INTRAMUSCULAR | Status: DC | PRN
  Administered 2013-02-11: 1 mg via INTRAMUSCULAR

## 2013-02-11 MED ORDER — DEXTROSE 5 % IV SOLN
2.0000 g | INTRAVENOUS | Status: DC
  Filled 2013-02-11: qty 2

## 2013-02-11 MED ORDER — NICOTINE 21 MG/24HR TD PT24
21.0000 mg | MEDICATED_PATCH | Freq: Every day | TRANSDERMAL | Status: DC
  Filled 2013-02-11: qty 1

## 2013-02-11 MED ORDER — CIPROFLOXACIN HCL 500 MG PO TABS
500.0000 mg | ORAL_TABLET | Freq: Two times a day (BID) | ORAL | Status: DC
  Filled 2013-02-11 (×2): qty 1

## 2013-02-11 MED ORDER — POTASSIUM CHLORIDE CRYS ER 20 MEQ PO TBCR
EXTENDED_RELEASE_TABLET | ORAL | Status: AC
  Filled 2013-02-11: qty 2

## 2013-02-11 MED ORDER — CIPROFLOXACIN HCL 500 MG PO TABS: 500.0000 mg | ORAL_TABLET | Freq: Two times a day (BID) | ORAL | Status: AC

## 2013-02-11 NOTE — Progress Notes (Addendum)
02/11/2013 12:27 PM  Pt refusing PIV insertion at this time.  Dr. Clyde Lundborg and Dr. Andrey Campanile at the bedside and are aware of this.   Norman Brooks   Pt also refusing to have telemetry placed back on him.  Dr. Andrey Campanile aware and is discontinuing the order for continuous cardiac monitoring. Norman Brooks

## 2013-02-11 NOTE — Progress Notes (Signed)
Subjective: Norman Brooks was seen and examined by me this AM.  He was agreeable to staying in the hospital for observation and getting set-up with ID clinic at [facility] for outpatient HIV follow-up.  Later in the morning Norman Brooks left AMA.  Objective: Vital signs in last 24 hours: Filed Vitals:   02/10/13 1757 02/10/13 2104 02/11/13 0500 02/11/13 0857  BP: 133/71 126/79 121/101 128/90  Pulse: 118 103 90 99  Temp:  98.1 F (36.7 C) 98.4 F (36.9 C) 98.6 F (37 C)  TempSrc:  Oral Oral Oral  Resp: 23 20 18 18   Height:  6' 0.01" (1.829 m)    Weight:  83.5 kg (184 lb 1.4 oz)    SpO2: 96% 98% 97% 97%   Weight change:   Intake/Output Summary (Last 24 hours) at 02/11/13 1338 Last data filed at 02/11/13 1037  Gross per 24 hour  Intake    400 ml  Output   2070 ml  Net  -1670 ml   General: resting in bed in NAD Cardiac: RRR, no rubs, murmurs or gallops Pulm: clear to auscultation bilaterally, moving normal volumes of air Abd: soft, nontender, nondistended, BS present Neuro: alert and oriented X3  Lab Results: Basic Metabolic Panel:  Recent Labs Lab 02/10/13 1311 02/11/13 0510  NA 133* 139  K 3.1* 4.5  CL 103 111  CO2 22 19  GLUCOSE 117* 113*  BUN 18 12  CREATININE 0.96 1.08  CALCIUM 8.6 8.8   Liver Function Tests:  Recent Labs Lab 02/10/13 1311 02/11/13 0510  AST 56* 48*  ALT 93* 81*  ALKPHOS 160* 145*  BILITOT 0.4 0.4  PROT 6.0 5.6*  ALBUMIN 2.8* 2.5*   No results found for this basename: LIPASE, AMYLASE,  in the last 168 hours No results found for this basename: AMMONIA,  in the last 168 hours CBC:  Recent Labs Lab 02/09/13 0806  02/10/13 1311 02/11/13 0510  WBC 3.9*  < > 2.3* 21.5*  NEUTROABS 3.5  --   --  PENDING  HGB 13.9  < > 11.9* 11.6*  HCT 40.4  < > 35.1* 33.6*  MCV 88.4  < > 88.6 88.0  PLT 210  < > 116* 127*  < > = values in this interval not displayed. Cardiac Enzymes:  Recent Labs Lab 02/09/13 0806 02/09/13 1245 02/10/13 0603  02/10/13 1409  CKTOTAL 159  --  61 68  TROPONINI <0.30 <0.30  --   --    BNP: No results found for this basename: PROBNP,  in the last 168 hours D-Dimer: No results found for this basename: DDIMER,  in the last 168 hours CBG:  Recent Labs Lab 02/09/13 1259  GLUCAP 129*   Urine Drug Screen: Drugs of Abuse     Component Value Date/Time   LABOPIA NONE DETECTED 02/10/2013 1403   COCAINSCRNUR NONE DETECTED 02/10/2013 1403   COCAINSCRNUR NEG 03/07/2011 1604   LABBENZ NONE DETECTED 02/10/2013 1403   LABBENZ NEG 03/07/2011 1604   AMPHETMU POSITIVE* 02/10/2013 1403   AMPHETMU NEG 03/07/2011 1604   THCU NONE DETECTED 02/10/2013 1403   LABBARB NONE DETECTED 02/10/2013 1403    Alcohol Level:  Recent Labs Lab 02/09/13 0806  ETH <11   Urinalysis:  Recent Labs Lab 02/09/13 1005 02/10/13 1403  COLORURINE YELLOW RED*  LABSPEC 1.017 1.017  PHURINE 5.5 5.0  GLUCOSEU NEGATIVE NEGATIVE  HGBUR LARGE* LARGE*  BILIRUBINUR NEGATIVE NEGATIVE  KETONESUR NEGATIVE NEGATIVE  PROTEINUR 100* 100*  UROBILINOGEN 0.2  0.2  NITRITE NEGATIVE NEGATIVE  LEUKOCYTESUR NEGATIVE SMALL*   Micro Results: Recent Results (from the past 240 hour(s))  URINE CULTURE     Status: None   Collection Time    02/09/13 10:05 AM      Result Value Range Status   Specimen Description URINE, CLEAN CATCH   Final   Special Requests NONE   Final   Culture  Setup Time     Final   Value: 02/09/2013 15:14     Performed at [facility] Count     Final   Value: NO GROWTH     Performed at [facility]   Culture     Final   Value: NO GROWTH     Performed at [facility]   Report Status 02/10/2013 FINAL   Final  CULTURE, BLOOD (ROUTINE X 2)     Status: None   Collection Time    02/10/13  4:05 PM      Result Value Range Status   Specimen Description BLOOD RIGHT HAND   Final   Special Requests BOTTLES DRAWN AEROBIC AND ANAEROBIC 5CC   Final   Culture  Setup Time     Final   Value: 02/10/2013  22:28     Performed at [facility]   Culture     Final   Value:        BLOOD CULTURE RECEIVED NO GROWTH TO DATE CULTURE WILL BE HELD FOR 5 DAYS BEFORE ISSUING A FINAL NEGATIVE REPORT     Performed at [facility]   Report Status PENDING   Incomplete  CULTURE, BLOOD (ROUTINE X 2)     Status: None   Collection Time    02/10/13  4:05 PM      Result Value Range Status   Specimen Description BLOOD LEFT HAND   Final   Special Requests BOTTLES DRAWN AEROBIC AND ANAEROBIC 5CC   Final   Culture  Setup Time     Final   Value: 02/10/2013 22:29     Performed at [facility]   Culture     Final   Value:        BLOOD CULTURE RECEIVED NO GROWTH TO DATE CULTURE WILL BE HELD FOR 5 DAYS BEFORE ISSUING A FINAL NEGATIVE REPORT     Performed at [facility]   Report Status PENDING   Incomplete   Studies/Results: Ct Abdomen Pelvis Wo Contrast  02/10/2013   *RADIOLOGY REPORT*  Clinical Data: Abdominal pain  CT ABDOMEN AND PELVIS WITHOUT CONTRAST  Technique:  Multidetector CT imaging of the abdomen and pelvis was performed following the standard protocol without intravenous contrast.  Comparison: 01/23/2012  Findings: The lung bases demonstrate some mild dependent atelectatic changes. A tiny left-sided pleural effusion is seen.  The liver, gallbladder, spleen, adrenal glands and pancreas are normal in their CT appearance.  Kidneys are well visualized bilaterally and reveal no renal calculi or obstructive changes.  The appendix is not well visualized although no inflammatory changes are noted to suggest appendicitis.  The bladder is partially distended.  Multiple phleboliths are seen.  No pelvic mass lesion or side wall abnormality is noted.  IMPRESSION: Left-sided pleural effusion and bibasilar atelectasis.  No other focal abnormality is seen.   Original Report Authenticated By: Alcide Clever, M.D.   Dg Chest Port 1 View  02/10/2013   *RADIOLOGY REPORT*  Clinical Data: HIV, abdominal  pain  PORTABLE CHEST - 1 VIEW  Comparison: Prior chest x-ray 02/09/2013  Findings: Persistent very low inspiratory volumes with increasing pulmonary vascular congestion now bordering on mild interstitial edema.  Stable cardiac and mediastinal contours.  Relatively gasless upper abdomen.  No acute osseous abnormality.  No pneumothorax or large effusion.  IMPRESSION:  1.  Increasing pulmonary vascular congestion now with borderline interstitial edema. 2.  Persistent very low inspiratory volumes.   Original Report Authenticated By: Malachy Moan, M.D.   Medications: I have reviewed the patient's current medications. Scheduled Meds: . sodium chloride  1,000 mL Intravenous Once  . ciprofloxacin  500 mg Oral BID  . heparin  5,000 Units Subcutaneous Q8H  . nicotine  21 mg Transdermal Daily  . potassium chloride SA      . sodium chloride  3 mL Intravenous Q12H   Continuous Infusions: . sodium chloride 1,000 mL (02/11/13 0643)   PRN Meds:.LORazepam Assessment/Plan: The patient is a 42 yo M, history of HIV, IVDA, presenting with amphetamine overdose.   # Amphetamine overdose - The patient presents after using an IV substance, with UDS positive for amphetamines, concerning for amphetamine overdose. Per poison control, treatment includes supportive care with IV fluids, and benzos prn, for approximately 20 hours. The patient is s/p 2 L NS and 2 mg ativan at [facility].  -continue NS at 150 cc/hr  -ativan 1 mg q6hrs prn for agitation   # AKI - Cr elevated to 1.2 on admission, from 0.6 on 12/24/12. Etiology is unclear, but may represent ATN (hypotension on admission) vs prerenal (BUN/Cr ratio > 20). Unlikely post-renal (pt voiding freely) vs AIN (pt on doxycycline 6 weeks ago).  -continue IV fluids per above   # Transaminitis - AST and ALT improving, likely due to direct effects from drug overdose vs mild shock liver (hypotension)  -monitor LFTs -continue IV fluids per above   # Anion Gap  Metabolic Acidosis - Resolved. Anion gap was 15 on admission, likely representing mild lactic acidosis (lactic acid = 2.94). On repeat, on the evening of admission, gap had closed to 11, and lactic acid downtrended to 2.0-->1.8.  # HIV - CD4 count = 510 (12/24/12), VL = 67 (10/21/11). Pt states that he was non-compliant with his HIV meds for several months, but restarted 2 months ago, taking a combination of his old leftover medications and a friend's medications, which were: Prezista, Truvada, Norvir  -medications initially held due to patient unresponsiveness and difficulty ensuring patient's actual HAART regimen, but plan to restart these medications  # Prophy - Heparin  Dispo: Disposition is deferred at this time, awaiting improvement of current medical problems.  Anticipated discharge in approximately 0 day(s).   The patient does have a current PCP Randall Hiss, MD) and does need an [facility] hospital follow-up appointment after discharge.  The patient does have transportation limitations that hinder transportation to clinic appointments.  .Services Needed at time of discharge: Y = Yes, Blank = No PT:   OT:   RN:   Equipment:   Other:     LOS: 1 day   Evelena Peat, DO 02/11/2013, 1:38 PM

## 2013-02-11 NOTE — Progress Notes (Signed)
02/11/2013 10:57 AM  Pt urinated about 600cc of bloody urine into urinal.  Sample sent to lab for analysis.  Dr. Andrey Campanile notified to make sure they did not want to visualize the urine, to which they declined.  MD to put in further orders for urine studies.  Pt also in room eating, even though he is NPO and has been explained that he is NPO.  Will monitor. Norman Brooks

## 2013-02-11 NOTE — Progress Notes (Signed)
Patient left AMA wearing a gown only.  Prescription for antibiotic was given to patient's mother.  Girlfriend taking patient home.  Dr. Andrey Campanile made aware.  Patient does not have an IV.

## 2013-02-11 NOTE — Progress Notes (Signed)
Patient very agitated and asking for a cigarette.  Patient leaving the room insistantly with the sitter and charge nurse present.  Patient did sign AMA paperwork.  Patient is angry that his girlfriend will not give him his clothing so he can leave.  Patient very belligerent with girlfriend at the elevators.  Patient proceeded to pull IV out.  Patient also removed his telemetry.  1mg  IM Ativan was given.  Patient did return to his room and is speaking with the physicians, his mother and his girlfriend.  Will monitor closely.

## 2013-02-11 NOTE — H&P (Signed)
Date: 02/11/2013  Patient name: Norman Brooks  Medical record number: [id number]  Date of birth: [birth year]   I have seen and evaluated Norman Brooks and discussed their care with the Residency Team.  Norman Brooks is a 42yo M with HIV, methamphetamine abuse who is being readmitted < 24hr of leaving AMA for abdominal pain, dark urine after using methamphetamines shortly after leaving the hospital. He was brought in by his significant other. On work up, he appears to have leukocytosis, on going tachycardia. In the ED, he had infectious work up started where it appeared that UA was consistent with possible uti thus he was started on antibiotics; he underwent abd/pelvis CT which ruled out nephrolithiasis. UA did reveal gross hematuria. He was admitted back to the medicine teaching service for evaluation and management of hematuria.  Physical Exam: Blood pressure 128/90, pulse 99, temperature 98.6 F (37 C), temperature source Oral, resp. rate 18, height 6' 0.01" (1.829 m), weight 184 lb 1.4 oz (83.5 kg), SpO2 97.00%. Physical Exam  Constitutional: He is oriented to person, place, and time. He appears well-developed and well-nourished. No distress.  HENT:  Mouth/Throat: Oropharynx is clear and moist. No oropharyngeal exudate.  Cardiovascular: tachycardia, regular rhythm and normal heart sounds. Exam reveals no gallop and no friction rub.  No murmur heard.  Pulmonary/Chest: Effort normal and breath sounds normal. No respiratory distress. He has no wheezes.  Abdominal: Soft. Bowel sounds are normal. He exhibits no distension. There is no tenderness.  Lymphadenopathy:  He has no cervical adenopathy.  Neurological: He is alert and oriented to person, place, and time.  Skin: Skin is warm and dry. No rash noted. No erythema.  Psychiatric: intermittently choosing to answer questions.   Lab results: Results for orders placed during the hospital encounter of 02/10/13 (from the past 24 hour(s))    COMPREHENSIVE METABOLIC PANEL     Status: Abnormal   Collection Time    02/10/13  1:11 PM      Result Value Range   Sodium 133 (*) 135 - 145 mEq/L   Potassium 3.1 (*) 3.5 - 5.1 mEq/L   Chloride 103  96 - 112 mEq/L   CO2 22  19 - 32 mEq/L   Glucose, Bld 117 (*) 70 - 99 mg/dL   BUN 18  6 - 23 mg/dL   Creatinine, Ser 0.86  0.50 - 1.35 mg/dL   Calcium 8.6  8.4 - 57.8 mg/dL   Total Protein 6.0  6.0 - 8.3 g/dL   Albumin 2.8 (*) 3.5 - 5.2 g/dL   AST 56 (*) 0 - 37 U/L   ALT 93 (*) 0 - 53 U/L   Alkaline Phosphatase 160 (*) 39 - 117 U/L   Total Bilirubin 0.4  0.3 - 1.2 mg/dL   GFR calc non Af Amer >90  >90 mL/min   GFR calc Af Amer >90  >90 mL/min  CBC     Status: Abnormal   Collection Time    02/10/13  1:11 PM      Result Value Range   WBC 2.3 (*) 4.0 - 10.5 K/uL   RBC 3.96 (*) 4.22 - 5.81 MIL/uL   Hemoglobin 11.9 (*) 13.0 - 17.0 g/dL   HCT 46.9 (*) 62.9 - 52.8 %   MCV 88.6  78.0 - 100.0 fL   MCH 30.1  26.0 - 34.0 pg   MCHC 33.9  30.0 - 36.0 g/dL   RDW 41.3  24.4 - 01.0 %   Platelets 116 (*) 150 -  400 K/uL  URINE RAPID DRUG SCREEN (HOSP PERFORMED)     Status: Abnormal   Collection Time    02/10/13  2:03 PM      Result Value Range   Opiates NONE DETECTED  NONE DETECTED   Cocaine NONE DETECTED  NONE DETECTED   Benzodiazepines NONE DETECTED  NONE DETECTED   Amphetamines POSITIVE (*) NONE DETECTED   Tetrahydrocannabinol NONE DETECTED  NONE DETECTED   Barbiturates NONE DETECTED  NONE DETECTED  URINALYSIS, ROUTINE W REFLEX MICROSCOPIC     Status: Abnormal   Collection Time    02/10/13  2:03 PM      Result Value Range   Color, Urine RED (*) YELLOW   APPearance TURBID (*) CLEAR   Specific Gravity, Urine 1.017  1.005 - 1.030   pH 5.0  5.0 - 8.0   Glucose, UA NEGATIVE  NEGATIVE mg/dL   Hgb urine dipstick LARGE (*) NEGATIVE   Bilirubin Urine NEGATIVE  NEGATIVE   Ketones, ur NEGATIVE  NEGATIVE mg/dL   Protein, ur 578 (*) NEGATIVE mg/dL   Urobilinogen, UA 0.2  0.0 - 1.0 mg/dL    Nitrite NEGATIVE  NEGATIVE   Leukocytes, UA SMALL (*) NEGATIVE  URINE MICROSCOPIC-ADD ON     Status: Abnormal   Collection Time    02/10/13  2:03 PM      Result Value Range   WBC, UA 7-10  <3 WBC/hpf   RBC / HPF TOO NUMEROUS TO COUNT  <3 RBC/hpf   Bacteria, UA FEW (*) RARE   Urine-Other MANY YEAST    CK     Status: None   Collection Time    02/10/13  2:09 PM      Result Value Range   Total CK 68  7 - 232 U/L  CG4 I-STAT (LACTIC ACID)     Status: None   Collection Time    02/10/13  3:29 PM      Result Value Range   Lactic Acid, Venous 1.56  0.5 - 2.2 mmol/L  CULTURE, BLOOD (ROUTINE X 2)     Status: None   Collection Time    02/10/13  4:05 PM      Result Value Range   Specimen Description BLOOD RIGHT HAND     Special Requests BOTTLES DRAWN AEROBIC AND ANAEROBIC 5CC     Culture  Setup Time       Value: 02/10/2013 22:28     Performed at [facility]   Culture       Value:        BLOOD CULTURE RECEIVED NO GROWTH TO DATE CULTURE WILL BE HELD FOR 5 DAYS BEFORE ISSUING A FINAL NEGATIVE REPORT     Performed at [facility]   Report Status PENDING    CULTURE, BLOOD (ROUTINE X 2)     Status: None   Collection Time    02/10/13  4:05 PM      Result Value Range   Specimen Description BLOOD LEFT HAND     Special Requests BOTTLES DRAWN AEROBIC AND ANAEROBIC 5CC     Culture  Setup Time       Value: 02/10/2013 22:29     Performed at [facility]   Culture       Value:        BLOOD CULTURE RECEIVED NO GROWTH TO DATE CULTURE WILL BE HELD FOR 5 DAYS BEFORE ISSUING A FINAL NEGATIVE REPORT     Performed at [facility]  Report Status PENDING    COMPREHENSIVE METABOLIC PANEL     Status: Abnormal   Collection Time    02/11/13  5:10 AM      Result Value Range   Sodium 139  135 - 145 mEq/L   Potassium 4.5  3.5 - 5.1 mEq/L   Chloride 111  96 - 112 mEq/L   CO2 19  19 - 32 mEq/L   Glucose, Bld 113 (*) 70 - 99 mg/dL   BUN 12  6 - 23 mg/dL   Creatinine, Ser  0.98  0.50 - 1.35 mg/dL   Calcium 8.8  8.4 - 11.9 mg/dL   Total Protein 5.6 (*) 6.0 - 8.3 g/dL   Albumin 2.5 (*) 3.5 - 5.2 g/dL   AST 48 (*) 0 - 37 U/L   ALT 81 (*) 0 - 53 U/L   Alkaline Phosphatase 145 (*) 39 - 117 U/L   Total Bilirubin 0.4  0.3 - 1.2 mg/dL   GFR calc non Af Amer 85 (*) >90 mL/min   GFR calc Af Amer >90  >90 mL/min  CBC     Status: Abnormal   Collection Time    02/11/13  5:10 AM      Result Value Range   WBC 21.5 (*) 4.0 - 10.5 K/uL   RBC 3.82 (*) 4.22 - 5.81 MIL/uL   Hemoglobin 11.6 (*) 13.0 - 17.0 g/dL   HCT 14.7 (*) 82.9 - 56.2 %   MCV 88.0  78.0 - 100.0 fL   MCH 30.4  26.0 - 34.0 pg   MCHC 34.5  30.0 - 36.0 g/dL   RDW 13.0  86.5 - 78.4 %   Platelets 127 (*) 150 - 400 K/uL  DIFFERENTIAL     Status: None   Collection Time    02/11/13  5:10 AM      Result Value Range   Neutrophils Relative % PENDING  43 - 77 %   Neutro Abs PENDING  1.7 - 7.7 K/uL   Band Neutrophils PENDING  0 - 10 %   Lymphocytes Relative PENDING  12 - 46 %   Lymphs Abs PENDING  0.7 - 4.0 K/uL   Monocytes Relative PENDING  3 - 12 %   Monocytes Absolute PENDING  0.1 - 1.0 K/uL   Eosinophils Relative PENDING  0 - 5 %   Eosinophils Absolute PENDING  0.0 - 0.7 K/uL   Basophils Relative PENDING  0 - 1 %   Basophils Absolute PENDING  0.0 - 0.1 K/uL   WBC Morphology PENDING     RBC Morphology PENDING     Smear Review PENDING     nRBC PENDING  0 /100 WBC   Metamyelocytes Relative PENDING     Myelocytes PENDING     Promyelocytes Absolute PENDING     Blasts PENDING      Imaging results:  Ct Abdomen Pelvis Wo Contrast  02/10/2013   *RADIOLOGY REPORT*  Clinical Data: Abdominal pain  CT ABDOMEN AND PELVIS WITHOUT CONTRAST  Technique:  Multidetector CT imaging of the abdomen and pelvis was performed following the standard protocol without intravenous contrast.  Comparison: 01/23/2012  Findings: The lung bases demonstrate some mild dependent atelectatic changes. A tiny left-sided pleural effusion  is seen.  The liver, gallbladder, spleen, adrenal glands and pancreas are normal in their CT appearance.  Kidneys are well visualized bilaterally and reveal no renal calculi or obstructive changes.  The appendix is not well visualized although no inflammatory changes are  noted to suggest appendicitis.  The bladder is partially distended.  Multiple phleboliths are seen.  No pelvic mass lesion or side wall abnormality is noted.  IMPRESSION: Left-sided pleural effusion and bibasilar atelectasis.  No other focal abnormality is seen.   Original Report Authenticated By: Alcide Clever, M.D.   Dg Chest Port 1 View  02/10/2013   *RADIOLOGY REPORT*  Clinical Data: HIV, abdominal pain  PORTABLE CHEST - 1 VIEW  Comparison: Prior chest x-ray 02/09/2013  Findings: Persistent very low inspiratory volumes with increasing pulmonary vascular congestion now bordering on mild interstitial edema.  Stable cardiac and mediastinal contours.  Relatively gasless upper abdomen.  No acute osseous abnormality.  No pneumothorax or large effusion.  IMPRESSION:  1.  Increasing pulmonary vascular congestion now with borderline interstitial edema. 2.  Persistent very low inspiratory volumes.   Original Report Authenticated By: Malachy Moan, M.D.    Assessment and Plan: I have seen and evaluated the patient as outlined above. I agree with the formulated Assessment and Plan as detailed in the residents' admission note, with the following changes:   We will start work up for gross hematuria. Due to abnormal ua nad urine cultures pending on 8/5, will continue with ceftriaxone for empiric coverage for uti as possible cause of hematuria in < 62 yo. We will review urine sediment to see if there is a glomerular process to determine need for further work up.  Patient is anxious to leave ,again threatening to leave AMA. Since he is able to make decisions, we will arrange for hte patient to undergo empiric treatment for UTI and have him follow up  in the resident's clinic as well as set up appt at [facility] for HIV follow up.  Judyann Munson, MD 8/7/201412:53 PM

## 2013-02-11 NOTE — Progress Notes (Signed)
Patient ID: Norman Brooks, male   DOB: August 29, 1970, 42 y.o.   MRN: [id number]   Subjective: Norman Brooks left AMA yesterday without signing the [facility] paperwork. According to his fiance he used IV methamphetamines after leaving the hospital. After using the methamphetamines he developed severe flank pain, grossly bloody urine, and per his fiance, he felt like he was dying. He went to [facility] and was found to meet SIRS criteria of tachycardia, tachypnea, and WBC < 4. Blood cultures were drawn x 2 and he was started on Zosyn and vancomycin. He was transferred to [facility] for further evaluation of hematuria and supportive care for methamphetamine intoxication. Per nursing, overnight he removed one of his peripheral IV's and was given lorazepam before the morning shift change. This morning he denies any flank pain, discomfort with urination, chest pain, or recent endurance exercise. He has had night sweats ever since he started a course of doxycycline approximately 2 months ago. 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   Objective: Vital signs in last 24 hours: Filed Vitals:   02/10/13 1757 02/10/13 2104 02/11/13 0500 02/11/13 0857  BP: 133/71 126/79 121/101 128/90  Pulse: 118 103 90 99  Temp:  98.1 F (36.7 C) 98.4 F (36.9 C) 98.6 F (37 C)  TempSrc:  Oral Oral Oral  Resp: 23 20 18 18   Height:  6' 0.01" (1.829 m)    Weight:  83.5 kg (184 lb 1.4 oz)    SpO2: 96% 98% 97% 97%   Weight change:   Intake/Output Summary (Last 24 hours) at 02/11/13 1328 Last data filed at 02/11/13 1037  Gross per 24 hour  Intake    400 ml  Output   2070 ml  Net  -1670 ml   Physical Exam  Constitutional:  Generally somnolent although he answered all of our questions  HENT:  Head: Normocephalic and atraumatic.  Eyes: Pupils are equal, round, and reactive to light.  Cardiovascular: Regular  rhythm and normal heart sounds.  Exam reveals no gallop and no friction rub.   No murmur heard. Tachycardic  Pulmonary/Chest:  Mildly decreased breath sounds throughout  Abdominal: Soft. There is no tenderness.  No CVA tenderness   Lab Results: CBC    Component Value Date/Time   WBC 21.5* 02/11/2013 0510   RBC 3.82* 02/11/2013 0510   HGB 11.6* 02/11/2013 0510   HCT 33.6* 02/11/2013 0510   PLT 127* 02/11/2013 0510   MCV 88.0 02/11/2013 0510   MCH 30.4 02/11/2013 0510   MCHC 34.5 02/11/2013 0510   RDW 13.9 02/11/2013 0510   LYMPHSABS PENDING 02/11/2013 0510   MONOABS PENDING 02/11/2013 0510   EOSABS PENDING 02/11/2013 0510   BASOSABS PENDING 02/11/2013 0510    CMP     Component Value Date/Time   NA 139 02/11/2013 0510   K 4.5 02/11/2013 0510   CL 111 02/11/2013 0510   CO2 19 02/11/2013 0510   GLUCOSE 113* 02/11/2013 0510   BUN 12 02/11/2013 0510   CREATININE 1.08 02/11/2013 0510   CREATININE 0.73 10/21/2011 0950   CALCIUM 8.8 02/11/2013 0510   PROT 5.6* 02/11/2013 0510   ALBUMIN 2.5* 02/11/2013 0510   AST 48* 02/11/2013 0510   ALT 81* 02/11/2013 0510   ALKPHOS 145* 02/11/2013 0510   BILITOT 0.4 02/11/2013 0510   GFRNONAA 85* 02/11/2013 0510   GFRAA >90 02/11/2013 0510   Urinalysis  Component Value Date/Time   COLORURINE RED* 02/11/2013 1138   APPEARANCEUR CLOUDY* 02/11/2013 1138   LABSPEC 1.008 02/11/2013 1138   PHURINE 5.0 02/11/2013 1138   GLUCOSEU NEGATIVE 02/11/2013 1138   HGBUR LARGE* 02/11/2013 1138   HGBUR moderate 05/28/2010 1144   BILIRUBINUR NEGATIVE 02/11/2013 1138   BILIRUBINUR NEGATIVE 10/21/2011 1034   KETONESUR 15* 02/11/2013 1138   PROTEINUR 100* 02/11/2013 1138   UROBILINOGEN 0.2 02/11/2013 1138   UROBILINOGEN 0.2 10/21/2011 1034   NITRITE NEGATIVE 02/11/2013 1138   NITRITE NEGATIVE 10/21/2011 1034   LEUKOCYTESUR MODERATE* 02/11/2013 1138    Drugs of Abuse     Component Value Date/Time   LABOPIA NONE DETECTED 02/10/2013 1403   COCAINSCRNUR NONE DETECTED 02/10/2013 1403   COCAINSCRNUR NEG 03/07/2011 1604   LABBENZ  NONE DETECTED 02/10/2013 1403   LABBENZ NEG 03/07/2011 1604   AMPHETMU POSITIVE* 02/10/2013 1403   AMPHETMU NEG 03/07/2011 1604   THCU NONE DETECTED 02/10/2013 1403   LABBARB NONE DETECTED 02/10/2013 1403     CK Total: 68 Micro Results: Recent Results (from the past 240 hour(s))  URINE CULTURE     Status: None   Collection Time    02/09/13 10:05 AM      Result Value Range Status   Specimen Description URINE, CLEAN CATCH   Final   Special Requests NONE   Final   Culture  Setup Time     Final   Value: 02/09/2013 15:14     Performed at [facility] Count     Final   Value: NO GROWTH     Performed at [facility]   Culture     Final   Value: NO GROWTH     Performed at [facility]   Report Status 02/10/2013 FINAL   Final  CULTURE, BLOOD (ROUTINE X 2)     Status: None   Collection Time    02/10/13  4:05 PM      Result Value Range Status   Specimen Description BLOOD RIGHT HAND   Final   Special Requests BOTTLES DRAWN AEROBIC AND ANAEROBIC 5CC   Final   Culture  Setup Time     Final   Value: 02/10/2013 22:28     Performed at [facility]   Culture     Final   Value:        BLOOD CULTURE RECEIVED NO GROWTH TO DATE CULTURE WILL BE HELD FOR 5 DAYS BEFORE ISSUING A FINAL NEGATIVE REPORT     Performed at [facility]   Report Status PENDING   Incomplete  CULTURE, BLOOD (ROUTINE X 2)     Status: None   Collection Time    02/10/13  4:05 PM      Result Value Range Status   Specimen Description BLOOD LEFT HAND   Final   Special Requests BOTTLES DRAWN AEROBIC AND ANAEROBIC 5CC   Final   Culture  Setup Time     Final   Value: 02/10/2013 22:29     Performed at [facility]   Culture     Final   Value:        BLOOD CULTURE RECEIVED NO GROWTH TO DATE CULTURE WILL BE HELD FOR 5 DAYS BEFORE ISSUING A FINAL NEGATIVE REPORT     Performed at [facility]   Report Status PENDING   Incomplete   Studies/Results: Ct Abdomen Pelvis Wo  Contrast  02/10/2013   *RADIOLOGY REPORT*  Clinical  Data: Abdominal pain  CT ABDOMEN AND PELVIS WITHOUT CONTRAST  Technique:  Multidetector CT imaging of the abdomen and pelvis was performed following the standard protocol without intravenous contrast.  Comparison: 01/23/2012  Findings: The lung bases demonstrate some mild dependent atelectatic changes. A tiny left-sided pleural effusion is seen.  The liver, gallbladder, spleen, adrenal glands and pancreas are normal in their CT appearance.  Kidneys are well visualized bilaterally and reveal no renal calculi or obstructive changes.  The appendix is not well visualized although no inflammatory changes are noted to suggest appendicitis.  The bladder is partially distended.  Multiple phleboliths are seen.  No pelvic mass lesion or side wall abnormality is noted.  IMPRESSION: Left-sided pleural effusion and bibasilar atelectasis.  No other focal abnormality is seen.   Original Report Authenticated By: Alcide Clever, M.D.   Dg Chest Port 1 View  02/10/2013   *RADIOLOGY REPORT*  Clinical Data: HIV, abdominal pain  PORTABLE CHEST - 1 VIEW  Comparison: Prior chest x-ray 02/09/2013  Findings: Persistent very low inspiratory volumes with increasing pulmonary vascular congestion now bordering on mild interstitial edema.  Stable cardiac and mediastinal contours.  Relatively gasless upper abdomen.  No acute osseous abnormality.  No pneumothorax or large effusion.  IMPRESSION:  1.  Increasing pulmonary vascular congestion now with borderline interstitial edema. 2.  Persistent very low inspiratory volumes.   Original Report Authenticated By: Malachy Moan, M.D.   Medications:  Scheduled Meds:  sodium chloride  1,000 mL Intravenous Once   ciprofloxacin  500 mg Oral BID   heparin  5,000 Units Subcutaneous Q8H   nicotine  21 mg Transdermal Daily   potassium chloride SA       sodium chloride  3 mL Intravenous Q12H   Continuous Infusions:  sodium chloride 1,000 mL  (02/11/13 0643)   PRN Meds:.LORazepam Assessment/Plan: [name] is a 42 year old male with a PMHx significant for HIV (CD4+ 510 on 12/2012) and IV drug abuse who presents as a transfer from [facility] Emergency Department with flank pain and gross hematuria.   **Methamphetamine Intoxication/Leukocytosis Patient's drug screen is positive for methamphetamines and, according to his fiance, he has used IV drugs since leaving AMA yesterday. His leukocytosis is likely due to a stress reaction to the methamphetamines. He has expressed a desire to quit using drugs. -Continue supportive care recommended by poison control: IVF, lorazepam 1 mg q 6 hours PRN. -Social work consult for methamphetamine cessation **Hematuria His hematuria could be due to several factors including STI, UTI, glomerulonephritis, rhabdomyolysis, or related to darunavir. Given that his urine has been grossly bloody it is unlikely that darunavir is responsible. We examined his urine under microscopy and the RBCs were primarily intact, which makes glomerulonephritis less likely. Total CK remains normal so rhabdomyolysis is also unlikely at this point.  -Urine GC/Chlamydia -Ceftriaxone for UTI coverage **HIV His last CD4+ T cell count was 510 on 12/24/12.  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  The care of the patient was discussed with Dr. Andrey Campanile and the assessment and plan formulated with their assistance.  Please see their attached note for official documentation of the daily encounter.   LOS: 1 day   Merrie Roof  02/11/2013, 1:28 PM

## 2013-02-11 NOTE — Progress Notes (Signed)
Subjective: Norman Brooks was seen and examined by me this AM.  He reports hematuria since yesterday.  He denies flank pain or dysuria, CP/SOB.  Early in the afternoon the patient patient pulled out his IV and signed AMA papers, however he did not immediately leave and agreed to stay for the day and receive oral antibiotics.  The pt did, however, leave AMA early in the afternoon.  Objective: Vital signs in last 24 hours: Filed Vitals:   02/10/13 1757 02/10/13 2104 02/11/13 0500 02/11/13 0857  BP: 133/71 126/79 121/101 128/90  Pulse: 118 103 90 99  Temp:  98.1 F (36.7 C) 98.4 F (36.9 C) 98.6 F (37 C)  TempSrc:  Oral Oral Oral  Resp: 23 20 18 18   Height:  6' 0.01" (1.829 m)    Weight:  83.5 kg (184 lb 1.4 oz)    SpO2: 96% 98% 97% 97%   Weight change:   Intake/Output Summary (Last 24 hours) at 02/11/13 1353 Last data filed at 02/11/13 1037  Gross per 24 hour  Intake    400 ml  Output   2070 ml  Net  -1670 ml   General: resting in bed in NAD Cardiac: tachycardic, no rubs, murmurs or gallops Pulm: decreased BS Abd: soft, nontender, nondistended, BS present, no CVAT Neuro: alert and oriented X3, following commands  Lab Results: Basic Metabolic Panel:  Recent Labs Lab 02/10/13 1311 02/11/13 0510  NA 133* 139  K 3.1* 4.5  CL 103 111  CO2 22 19  GLUCOSE 117* 113*  BUN 18 12  CREATININE 0.96 1.08  CALCIUM 8.6 8.8   Liver Function Tests:  Recent Labs Lab 02/10/13 1311 02/11/13 0510  AST 56* 48*  ALT 93* 81*  ALKPHOS 160* 145*  BILITOT 0.4 0.4  PROT 6.0 5.6*  ALBUMIN 2.8* 2.5*   CBC:  Recent Labs Lab 02/09/13 0806  02/10/13 1311 02/11/13 0510  WBC 3.9*  < > 2.3* 21.5*  NEUTROABS 3.5  --   --  PENDING  HGB 13.9  < > 11.9* 11.6*  HCT 40.4  < > 35.1* 33.6*  MCV 88.4  < > 88.6 88.0  PLT 210  < > 116* 127*  < > = values in this interval not displayed. Cardiac Enzymes:  Recent Labs Lab 02/09/13 0806 02/09/13 1245 02/10/13 0603 02/10/13 1409    CKTOTAL 159  --  61 68  TROPONINI <0.30 <0.30  --   --     CBG:  Recent Labs Lab 02/09/13 1259  GLUCAP 129*   Urine Drug Screen: Drugs of Abuse     Component Value Date/Time   LABOPIA NONE DETECTED 02/10/2013 1403   COCAINSCRNUR NONE DETECTED 02/10/2013 1403   COCAINSCRNUR NEG 03/07/2011 1604   LABBENZ NONE DETECTED 02/10/2013 1403   LABBENZ NEG 03/07/2011 1604   AMPHETMU POSITIVE* 02/10/2013 1403   AMPHETMU NEG 03/07/2011 1604   THCU NONE DETECTED 02/10/2013 1403   LABBARB NONE DETECTED 02/10/2013 1403    Alcohol Level:  Recent Labs Lab 02/09/13 0806  ETH <11   Urinalysis:  Recent Labs Lab 02/09/13 1005 02/10/13 1403  COLORURINE YELLOW RED*  LABSPEC 1.017 1.017  PHURINE 5.5 5.0  GLUCOSEU NEGATIVE NEGATIVE  HGBUR LARGE* LARGE*  BILIRUBINUR NEGATIVE NEGATIVE  KETONESUR NEGATIVE NEGATIVE  PROTEINUR 100* 100*  UROBILINOGEN 0.2 0.2  NITRITE NEGATIVE NEGATIVE  LEUKOCYTESUR NEGATIVE SMALL*    Micro Results: Recent Results (from the past 240 hour(s))  URINE CULTURE     Status: None  Collection Time    02/09/13 10:05 AM      Result Value Range Status   Specimen Description URINE, CLEAN CATCH   Final   Special Requests NONE   Final   Culture  Setup Time     Final   Value: 02/09/2013 15:14     Performed at [facility] Count     Final   Value: NO GROWTH     Performed at [facility]   Culture     Final   Value: NO GROWTH     Performed at [facility]   Report Status 02/10/2013 FINAL   Final  CULTURE, BLOOD (ROUTINE X 2)     Status: None   Collection Time    02/10/13  4:05 PM      Result Value Range Status   Specimen Description BLOOD RIGHT HAND   Final   Special Requests BOTTLES DRAWN AEROBIC AND ANAEROBIC 5CC   Final   Culture  Setup Time     Final   Value: 02/10/2013 22:28     Performed at [facility]   Culture     Final   Value:        BLOOD CULTURE RECEIVED NO GROWTH TO DATE CULTURE WILL BE HELD FOR 5 DAYS BEFORE  ISSUING A FINAL NEGATIVE REPORT     Performed at [facility]   Report Status PENDING   Incomplete  CULTURE, BLOOD (ROUTINE X 2)     Status: None   Collection Time    02/10/13  4:05 PM      Result Value Range Status   Specimen Description BLOOD LEFT HAND   Final   Special Requests BOTTLES DRAWN AEROBIC AND ANAEROBIC 5CC   Final   Culture  Setup Time     Final   Value: 02/10/2013 22:29     Performed at [facility]   Culture     Final   Value:        BLOOD CULTURE RECEIVED NO GROWTH TO DATE CULTURE WILL BE HELD FOR 5 DAYS BEFORE ISSUING A FINAL NEGATIVE REPORT     Performed at [facility]   Report Status PENDING   Incomplete   Studies/Results: Ct Abdomen Pelvis Wo Contrast  02/10/2013   *RADIOLOGY REPORT*  Clinical Data: Abdominal pain  CT ABDOMEN AND PELVIS WITHOUT CONTRAST  Technique:  Multidetector CT imaging of the abdomen and pelvis was performed following the standard protocol without intravenous contrast.  Comparison: 01/23/2012  Findings: The lung bases demonstrate some mild dependent atelectatic changes. A tiny left-sided pleural effusion is seen.  The liver, gallbladder, spleen, adrenal glands and pancreas are normal in their CT appearance.  Kidneys are well visualized bilaterally and reveal no renal calculi or obstructive changes.  The appendix is not well visualized although no inflammatory changes are noted to suggest appendicitis.  The bladder is partially distended.  Multiple phleboliths are seen.  No pelvic mass lesion or side wall abnormality is noted.  IMPRESSION: Left-sided pleural effusion and bibasilar atelectasis.  No other focal abnormality is seen.   Original Report Authenticated By: Alcide Clever, M.D.   Dg Chest Port 1 View  02/10/2013   *RADIOLOGY REPORT*  Clinical Data: HIV, abdominal pain  PORTABLE CHEST - 1 VIEW  Comparison: Prior chest x-ray 02/09/2013  Findings: Persistent very low inspiratory volumes with increasing pulmonary vascular  congestion now bordering on mild interstitial edema.  Stable cardiac and mediastinal contours.  Relatively gasless upper abdomen.  No acute osseous abnormality.  No pneumothorax or large effusion.  IMPRESSION:  1.  Increasing pulmonary vascular congestion now with borderline interstitial edema. 2.  Persistent very low inspiratory volumes.   Original Report Authenticated By: Malachy Moan, M.D.   Medications: I have reviewed the patient's current medications. Scheduled Meds: . sodium chloride  1,000 mL Intravenous Once  . ciprofloxacin  500 mg Oral BID  . heparin  5,000 Units Subcutaneous Q8H  . nicotine  21 mg Transdermal Daily  . potassium chloride SA      . sodium chloride  3 mL Intravenous Q12H   Continuous Infusions: . sodium chloride 1,000 mL (02/11/13 0643)   PRN Meds:.LORazepam  Assessment/Plan: Norman Brooks is a 42 year old male with a PMHx significant for HIV (Last CD4+ count 510 on 12/24/12), IV drug abuse, kidney stones who presents as a transfer from [facility] for acute complaints of drug overdose and flank pain, found again to have a positive UDS for amphetamines, and hematuria.   # Amphetamine overdose sequalae - UDS positive for amphetamines. Per poison control, treatment includes supportive care with IV fluids, and benzos prn, for approximately 20 hours.  - Continue NS at 161ml/hr  - Ativan 1 mg q6hrs prn for agitation   # Hematuria - No evidence of renal calculi on CT. Patient with positive urine dipstick for Hgb as far back as 2013. However urine today is grossly bloody.  CK WNL x2, making rhabdomyolysis less likely.  Per [employer] hematuria is a known adverse effect of darunavir in less than 2% of patients.  Also could be a sequelae of methamphetamine use. UTI is another possibility.  Urine microscopy shows no dysmorphic RBCs, no RBC cast making intrinsic renal etiology unlikely.  Will treat as UTI. - 2g IV ceftriaxone daily - Follow up urine culture   #  AKI - BUN/Cr now 12/1.08.  - continue fluids   # Hypokalemia - resolved, 4.5   # Transaminitis - AST and ALT mildly elevated but improved from last admission, likely due to direct effects from drug overdose vs mild shock liver (hypotension)  - monitor LFTs  - continue IV fluids   # HIV - CD4 count = 510 (12/24/12), VL = 67 (10/21/11). Unclear compliance with home regimen.  - Continue darunavir, truvada, ritonavir   # DVT PPX - subq heparin and SCDs  Dispo: Disposition is deferred at this time, awaiting improvement of current medical problems.  Anticipated discharge in approximately 1 day(s).   The patient does have a current PCP Randall Hiss, MD) and does need an [facility] hospital follow-up appointment after discharge.  The patient does have transportation limitations that hinder transportation to clinic appointments.  .Services Needed at time of discharge: Y = Yes, Blank = No PT:   OT:   RN:   Equipment:   Other:     LOS: 1 day   Evelena Peat, DO 02/11/2013, 1:53 PM

## 2013-02-12 NOTE — ED Provider Notes (Signed)
  I performed a history and physical examination of Norman Brooks and discussed his management with Ms. Harris.  I agree with the history, physical, assessment, and plan of care, with the following exceptions: None  I was present for the following procedures: None Time Spent in Critical Care of the patient: None Time spent in discussions with the patient and family: 5  Norman Brooks S  42 y.o. Male with substance abuse history, and hiv noncompliant with meds presents after leaving ama this ama.  He is tachycardiac and has bilateral flank pain.  Patient is covered for sepsis and has ct pending.  Patient was discussed by me with Ms. Harris and she signed patient out to Dr. Effie Brooks and [name].   Norman Quarry, MD 02/12/13 [phone]

## 2013-02-12 NOTE — Discharge Summary (Signed)
Patient Name:  Norman Brooks  MRN: [id number]  PCP: Acey Lav, MD  DOB:  August 22, 1970       Date of Admission:  02/10/2013  Date of Discharge:  02/11/2013      Attending Physician: Dr. Judyann Munson      The patient left AMA on the afternoon of 02/11/2013 before full work-up or treatment could be provided.     DISCHARGE DIAGNOSES: 1. Amphetamine overdose 2. UTI 3. HIV  DISPOSITION AND FOLLOW-UP: Uziel Covault is to follow-up with the listed providers as detailed below, at patient's visiting, please address following issues:  Medication compliance, addiction.   An appointment was scheduled for Mr. Vahle at the [facility] as he has attempted to establish care there in the past.  The appointment is scheduled for 02/24/13 at at [facility].  The clinic receptionist was unsuccessful in her attempt to reach Mr. Rosevear by phone but she will send a letter with the appointment info to his home address.     DISCHARGE MEDICATIONS:   Medication List         ciprofloxacin 500 MG tablet  Commonly known as:  CIPRO  Take 1 tablet (500 mg total) by mouth 2 (two) times daily.     darunavir 400 MG tablet  Commonly known as:  PREZISTA  Take 2 tablets (800 mg total) by mouth daily.     emtricitabine-tenofovir 200-300 MG per tablet  Commonly known as:  TRUVADA  Take 1 tablet by mouth daily.     LORazepam 1 MG tablet  Commonly known as:  ATIVAN  Take 2 mg by mouth every 8 (eight) hours.     ritonavir 100 MG capsule  Commonly known as:  NORVIR  Take 1 capsule (100 mg total) by mouth daily.         CONSULTS:    None   PROCEDURES PERFORMED:  Ct Abdomen Pelvis Wo Contrast  02/10/2013   *RADIOLOGY REPORT*  Clinical Data: Abdominal pain  CT ABDOMEN AND PELVIS WITHOUT CONTRAST  Technique:  Multidetector CT imaging of the abdomen and pelvis was performed following the standard protocol without intravenous contrast.  Comparison: 01/23/2012  Findings: The  lung bases demonstrate some mild dependent atelectatic changes. A tiny left-sided pleural effusion is seen.  The liver, gallbladder, spleen, adrenal glands and pancreas are normal in their CT appearance.  Kidneys are well visualized bilaterally and reveal no renal calculi or obstructive changes.  The appendix is not well visualized although no inflammatory changes are noted to suggest appendicitis.  The bladder is partially distended.  Multiple phleboliths are seen.  No pelvic mass lesion or side wall abnormality is noted.  IMPRESSION: Left-sided pleural effusion and bibasilar atelectasis.  No other focal abnormality is seen.   Original Report Authenticated By: Alcide Clever, M.D.   Ct Head Wo Contrast  02/09/2013   *RADIOLOGY REPORT*  Clinical Data:  Drug problem.  Altered mental status.  HIV positive  CT HEAD WITHOUT CONTRAST  Technique:  Contiguous axial images were obtained from the base of the skull through the vertex without contrast  Comparison:  None.  Findings:  The brain has a normal appearance without evidence for hemorrhage, acute infarction, hydrocephalus, or mass lesion.  There is no extra axial fluid collection.  The skull and paranasal sinuses are normal.  IMPRESSION: Normal CT of the head without contrast.   Original Report Authenticated By: Janeece Riggers, M.D.   Dg Chest Port 1 View  02/10/2013   *  RADIOLOGY REPORT*  Clinical Data: HIV, abdominal pain  PORTABLE CHEST - 1 VIEW  Comparison: Prior chest x-ray 02/09/2013  Findings: Persistent very low inspiratory volumes with increasing pulmonary vascular congestion now bordering on mild interstitial edema.  Stable cardiac and mediastinal contours.  Relatively gasless upper abdomen.  No acute osseous abnormality.  No pneumothorax or large effusion.  IMPRESSION:  1.  Increasing pulmonary vascular congestion now with borderline interstitial edema. 2.  Persistent very low inspiratory volumes.   Original Report Authenticated By: Malachy Moan, M.D.    Dg Chest Port 1 View  02/09/2013   *RADIOLOGY REPORT*  Clinical Data: Chest pain and palpitations after injection of A recreational drugs.  Smoker with current history of HIV.  PORTABLE CHEST - 1 VIEW  Comparison: Two-view chest x-ray 12/24/2012, 09/05/2010.  Findings: Near-expiratory image accounts for atelectasis at the bases and crowding of bronchovascular markings diffusely.  This also accentuates the heart size.  Taking this into account, cardiomediastinal silhouette unremarkable.  Lungs otherwise clear. No pleural effusions.  No pneumothorax.  IMPRESSION: Near-expiratory image accounts for atelectasis at the lung bases. No acute cardiopulmonary disease otherwise.   Original Report Authenticated By: Hulan Saas, M.D.       ADMISSION DATA: H&P: Mr. Rito Lecomte is a 42 year old male with a PMHx significant for HIV (Last CD4+ count 510 on 12/24/12), IV drug abuse, kidney stones who presents as a transfer from [facility] for acute complaints of drug overdose and flank pain.  The patient was admitted to our service the day before yesterday (on 02/09/2013) with similar complaints. UDS was positive for amphetamines. Poison control was contacted, and they recommended supportive care with fluids and benzodiazepines. He was found to have transaminitis and AKI on lab workup.  Patient left AMA this morning (on 02/10/13) around 0500, because, according to his fiance, he was agitated from coming down off of the methamphetamine and felt he was not getting enough Ativan. The patient left and went to a friend's house to get high via IV crystal meth. Afterwards he had immediate BL flank pain, body aches, racing heart, nausea. He also now complains of dark colored urine. Of note, the patient was off of his HIV antiviral medications for several months and just recently began taking them again.  Upon presentation to the emergency department, the patient was noted to be tachycardic (130s-140s),  tachypneic (RR 26-31), and leukopenic (WBC 2.3). Since SIRS criteria were met with these parameters, the patient was given 1L normal saline IV fluids and started on antibiotics (vancomycin and zosyn). Other notable laboratory studies included UDS positive for amphetamine, thrombocytopenia (116), mild transaminitis improved from prior studies, hypokalemia (K=3.1). Urinalysis was notable for large Hgb on dipstick and too numerous to count red blood cells per high-power field on microscopy. CK and lactate levels were within normal limits. A CT of the head was performed, which was normal. Chest x-ray showed no acute cardiopulmonary disease. Noncontrast CT was negative for renal calculi or obstructive process.   Admission Physical Exam:  Blood pressure 126/79, pulse 103, temperature 98.1 F (36.7 C), temperature source Oral, resp. rate 20, height 6' 0.01" (1.829 m), weight 184 lb 1.4 oz (83.5 kg), SpO2 98.00%.  Physical Exam  Constitutional: He is well-developed, well-nourished, and in no distress. No distress.  Seen s/p Ativan administration. Patient somnolent.  HENT:  Head: Normocephalic and atraumatic.  Eyes: Pupils are equal, round, and reactive to light.  Cardiovascular: Normal rate, regular rhythm, normal heart sounds and intact  distal pulses. Exam reveals no gallop and no friction rub.  No murmur heard. Mild tachycardia noted (PR ~100).  Pulmonary/Chest: Effort normal and breath sounds normal. No respiratory distress. He has no wheezes. He has no rales. He exhibits no tenderness.  Abdominal: Soft. Bowel sounds are normal. He exhibits no distension. There is no tenderness. There is no CVA tenderness.  Skin: Skin is warm and dry. He is not diaphoretic.   Labs: Basic Metabolic Panel:   Recent Labs   02/10/13 0603  02/10/13 1311   NA  137  133*   K  3.7  3.1*   CL  107  103   CO2  22  22   GLUCOSE  133*  117*   BUN  25*  18   CREATININE  1.06  0.96   CALCIUM  8.6  8.6    Liver  Function Tests:   Recent Labs   02/10/13 0603  02/10/13 1311   AST  72*  56*   ALT  107*  93*   ALKPHOS  80  160*   BILITOT  0.3  0.4   PROT  6.1  6.0   ALBUMIN  2.7*  2.8*    No results found for this basename: LIPASE, AMYLASE, in the last 72 hours  No results found for this basename: AMMONIA, in the last 72 hours  CBC:   Recent Labs   02/09/13 0806  02/10/13 0603  02/10/13 1311   WBC  3.9*  26.9*  2.3*   NEUTROABS  3.5  --  --   HGB  13.9  11.7*  11.9*   HCT  40.4  33.9*  35.1*   MCV  88.4  87.6  88.6   PLT  210  171  116*    Cardiac Enzymes:   Recent Labs   02/09/13 0806  02/09/13 1245  02/10/13 0603  02/10/13 1409   CKTOTAL  159  --  61  68   TROPONINI  <0.30  <0.30  --  --    CBG:   Recent Labs   02/09/13 1259   GLUCAP  129*    Urine Drug Screen:  Drugs of Abuse    Component  Value  Date/Time    LABOPIA  NONE DETECTED  02/10/2013 1403    COCAINSCRNUR  NONE DETECTED  02/10/2013 1403    COCAINSCRNUR  NEG  03/07/2011 1604    LABBENZ  NONE DETECTED  02/10/2013 1403    LABBENZ  NEG  03/07/2011 1604    AMPHETMU  POSITIVE*  02/10/2013 1403    AMPHETMU  NEG  03/07/2011 1604    THCU  NONE DETECTED  02/10/2013 1403    LABBARB  NONE DETECTED  02/10/2013 1403    Alcohol Level:   Recent Labs   02/09/13 0806   ETH  <11    Urinalysis:   Recent Labs   02/09/13 1005  02/10/13 1403   COLORURINE  YELLOW  RED*   LABSPEC  1.017  1.017   PHURINE  5.5  5.0   GLUCOSEU  NEGATIVE  NEGATIVE   HGBUR  LARGE*  LARGE*   BILIRUBINUR  NEGATIVE  NEGATIVE   KETONESUR  NEGATIVE  NEGATIVE   PROTEINUR  100*  100*   UROBILINOGEN  0.2  0.2   NITRITE  NEGATIVE  NEGATIVE   LEUKOCYTESUR  NEGATIVE  SMALL*    Misc. Labs:  Lactic Acid, Venous   Date  Value  Range  Status  02/10/2013  1.56  0.5 - 2.2 mmol/L  Final   02/10/2013  1.8  0.5 - 2.2 mmol/L  Final   02/09/2013  2.0  0.5 - 2.2 mmol/L  Final   02/09/2013  2.94*  0.5 - 2.2 mmol/L  Final    Total CK   Date  Value  Range  Status    02/10/2013  68  7 - 232 U/L  Final   02/10/2013  61  7 - 232 U/L  Final     HOSPITAL COURSE: Mr. Duque left [facility] yesterday 02/10/13 without signing AMA paperwork. According to his fiance he used IV methamphetamines after leaving the hospital. After using the methamphetamines he developed severe flank pain, grossly bloody urine, and per his fiance, he felt like he was dying. Last night he went to the [facility] Emergency Department and was found to meet SIRS criteria of tachycardia, tachypnea, and WBC < 4. Blood cultures were drawn x 2 and he was started on Zosyn and vancomycin. He was transferred back to [facility] last night for further evaluation of hematuria and supportive care for methamphetamine intoxication. Per nursing, overnight he removed one of his peripheral IV's and was given lorazepam before the morning shift change. This morning he denied any flank pain, discomfort with urination, chest pain, or recent endurance exercise.   In the early afternoon of 02/11/13, Mr. Laubacher became increasingly agitated and threatened to leave AMA. He signed the AMA paperwork at this time. Since he refused to continue IV a prescription for oral ciprofloxacin 500 mg tablet was given to his mother, who was present today. Mr. Deer subsequently left against medical advice early in the afternoon.  DISCHARGE DATA: Vital Signs: BP 128/90  Pulse 99  Temp(Src) 98.6 F (37 C) (Oral)  Resp 18  Ht 6' 0.01" (1.829 m)  Wt 83.5 kg (184 lb 1.4 oz)  BMI 24.96 kg/m2  SpO2 97%  Labs: K 4.5, AST 48, ALT 81, Tbili 0.4, Albumin 2.5, Tprotein 5.6 WBC 21.5, Hgb 11.6, HCT 33.6, Plt 127  Services Ordered on Discharge: Y = Yes; Blank = No PT:   OT:   RN:   Equipment:   Other:      Time Spent on Discharge: 35 min   Signed: Evelena Peat PGY 1, Internal Medicine Resident 02/12/2013, 5:27 PM

## 2013-02-12 NOTE — Progress Notes (Signed)
I agree with the findings and plan by Student Doctor Yetta Barre.

## 2013-02-14 NOTE — H&P (Signed)
I have discussed with Student Doctor Yetta Barre and agree with his A/P.

## 2013-02-15 LAB — CULTURE, BLOOD (ROUTINE X 2): Culture: NO GROWTH

## 2013-02-15 NOTE — Discharge Summary (Signed)
  Date: 02/15/2013  Patient name: Norman Brooks  Medical record number: [id number]  Date of birth: [birth year]   This patient has been seen and the plan of care was discussed with the house staff. Please see their note for complete details. I concur with their findings with the following additions/corrections:  Patient was readmitted for having hematuria, and shortly after leaving AMA in order to use further illicit drugs. He was started on empiric regimen of cipro for presumed UTI as the cause of his hematuria. His urine microscopy and sediment analysis did not reveal glomerular process. He left prematurely without completely the course of his work up or to see if he had improvement in his symptoms with antibiotics. He was given rx for cipro to finish course of UTI and appt made with [facility] for follow for his HIV.  Judyann Munson, MD 02/15/2013, 9:17 PM

## 2013-07-16 ENCOUNTER — Emergency Department (HOSPITAL_BASED_OUTPATIENT_CLINIC_OR_DEPARTMENT_OTHER): Payer: BC Managed Care – PPO

## 2013-07-16 ENCOUNTER — Encounter (HOSPITAL_BASED_OUTPATIENT_CLINIC_OR_DEPARTMENT_OTHER): Payer: Self-pay | Admitting: Emergency Medicine

## 2013-07-16 ENCOUNTER — Emergency Department (HOSPITAL_BASED_OUTPATIENT_CLINIC_OR_DEPARTMENT_OTHER)
Admission: EM | Admit: 2013-07-16 | Discharge: 2013-07-17 | Disposition: A | Discharge status: Home / Self Care | Payer: BC Managed Care – PPO | Attending: Emergency Medicine | Admitting: Emergency Medicine

## 2013-07-16 DIAGNOSIS — Z792 Long term (current) use of antibiotics: Secondary | ICD-10-CM | POA: Insufficient documentation

## 2013-07-16 DIAGNOSIS — S91311A Laceration without foreign body, right foot, initial encounter: Secondary | ICD-10-CM

## 2013-07-16 DIAGNOSIS — F17200 Nicotine dependence, unspecified, uncomplicated: Secondary | ICD-10-CM | POA: Insufficient documentation

## 2013-07-16 DIAGNOSIS — Y9289 Other specified places as the place of occurrence of the external cause: Secondary | ICD-10-CM | POA: Insufficient documentation

## 2013-07-16 DIAGNOSIS — Z21 Asymptomatic human immunodeficiency virus [HIV] infection status: Secondary | ICD-10-CM | POA: Insufficient documentation

## 2013-07-16 DIAGNOSIS — W298XXA Contact with other powered powered hand tools and household machinery, initial encounter: Secondary | ICD-10-CM | POA: Insufficient documentation

## 2013-07-16 DIAGNOSIS — F329 Major depressive disorder, single episode, unspecified: Secondary | ICD-10-CM | POA: Insufficient documentation

## 2013-07-16 DIAGNOSIS — F411 Generalized anxiety disorder: Secondary | ICD-10-CM | POA: Insufficient documentation

## 2013-07-16 DIAGNOSIS — Y9389 Activity, other specified: Secondary | ICD-10-CM | POA: Insufficient documentation

## 2013-07-16 DIAGNOSIS — F3289 Other specified depressive episodes: Secondary | ICD-10-CM | POA: Insufficient documentation

## 2013-07-16 DIAGNOSIS — S91309A Unspecified open wound, unspecified foot, initial encounter: Secondary | ICD-10-CM | POA: Insufficient documentation

## 2013-07-16 DIAGNOSIS — Z79899 Other long term (current) drug therapy: Secondary | ICD-10-CM | POA: Insufficient documentation

## 2013-07-16 MED ORDER — HYDROMORPHONE HCL PF 1 MG/ML IJ SOLN: 1.0000 mg | Freq: Once | INTRAMUSCULAR | Status: DC

## 2013-07-16 MED ORDER — AMOXICILLIN-POT CLAVULANATE 875-125 MG PO TABS: 1.0000 | ORAL_TABLET | Freq: Two times a day (BID) | ORAL | Status: AC

## 2013-07-16 MED ORDER — ONDANSETRON HCL 4 MG/2ML IJ SOLN: 4.0000 mg | Freq: Once | INTRAMUSCULAR | Status: DC

## 2013-07-16 MED ORDER — OXYCODONE-ACETAMINOPHEN 5-325 MG PO TABS: 1.0000 | ORAL_TABLET | Freq: Four times a day (QID) | ORAL | Status: AC | PRN

## 2013-07-16 MED ORDER — ONDANSETRON HCL 4 MG/2ML IJ SOLN
INTRAMUSCULAR | Status: AC
  Administered 2013-07-16: 4 mg
  Filled 2013-07-16: qty 2

## 2013-07-16 MED ORDER — HYDROMORPHONE HCL PF 1 MG/ML IJ SOLN
INTRAMUSCULAR | Status: AC
  Administered 2013-07-16: 1 mg
  Filled 2013-07-16: qty 1

## 2013-07-16 NOTE — ED Notes (Signed)
Pt states that he was outside in flip flops and was cutting wood with a chainsaw. Pt states that he then cut his foot by accident. Pt has several lacerations to his achillis area of his right foot. Bandage applied.

## 2013-07-16 NOTE — Discharge Instructions (Signed)
Delayed Wound Closure  Sometimes, your health care provider will decide to delay closing a wound for several days. This is done when the wound is badly bruised, dirty, or when it has been several hours since the injury happened. By delaying the closure of your wound, the risk of infection is reduced. Wounds that are closed in 3 7 days after being cleaned up and dressed heal just as well as those that are closed right away.  HOME CARE INSTRUCTIONS  · Rest and elevate the injured area until the pain and swelling are gone.  · Have your wound checked as instructed by your health care provider.  SEEK MEDICAL CARE IF:  · You develop unusual or increased swelling or redness around the wound.  · You have increasing pain or tenderness.  · There is increasing fluid (drainage) or a bad smelling drainage coming from the wound.  Document Released: 06/24/2005 Document Revised: 02/24/2013 Document Reviewed: 12/22/2012  Exit[employer]® Patient Information ©2014 [employer].

## 2013-07-16 NOTE — ED Provider Notes (Signed)
CSN: [id number]     Arrival date & time 07/16/13  2150 History  This chart was scribed for Charles B. Bernette Mayers, MD by Danella Maiers, ED Scribe. This patient was seen in room MH05/MH05 and the patient's care was started at 10:08 PM.   Chief Complaint  Patient presents with  . Extremity Laceration   The history is provided by the patient. No language interpreter was used.   HPI Comments: Norman Brooks is a 43 y.o. male who presents to the Emergency Department complaining of laceration to the achilles area of his right foot. Pt states he was outside chopping wood with a chainsaw in flip flops. He was standing on two brick pillars cutting the wood when he accidentally hit his foot.    Past Medical History  Diagnosis Date  . HIV (human immunodeficiency virus infection)   . Depression   . Anxiety    Past Surgical History  Procedure Laterality Date  . Hernia repair Left ` 2007   History reviewed. No pertinent family history. History  Substance Use Topics  . Smoking status: Current Every Day Smoker -- 1.00 packs/day for 25 years    Types: Cigarettes  . Smokeless tobacco: Never Used  . Alcohol Use: No    Review of Systems  Skin: Positive for wound.   A complete 10 system review of systems was obtained and all systems are negative except as noted in the HPI and PMH.   Allergies  Dapsone and Sulfamethoxazole-trimethoprim  Home Medications   Current Outpatient Rx  Name  Route  Sig  Dispense  Refill  . ciprofloxacin (CIPRO) 500 MG tablet   Oral   Take 1 tablet (500 mg total) by mouth 2 (two) times daily.   14 tablet   0   . darunavir (PREZISTA) 400 MG tablet   Oral   Take 2 tablets (800 mg total) by mouth daily.   60 tablet   5   . emtricitabine-tenofovir (TRUVADA) 200-300 MG per tablet   Oral   Take 1 tablet by mouth daily.   30 tablet   5   . LORazepam (ATIVAN) 1 MG tablet   Oral   Take 2 mg by mouth every 8 (eight) hours.         . ritonavir (NORVIR) 100 MG  capsule   Oral   Take 1 capsule (100 mg total) by mouth daily.   30 capsule   0    BP 150/112  Pulse 90  Temp(Src) 98.1 F (36.7 C) (Oral)  Resp 20  Ht 6' (1.829 m)  Wt 195 lb (88.451 kg)  BMI 26.44 kg/m2  SpO2 100% Physical Exam  Nursing note and vitals reviewed. Constitutional: He is oriented to person, place, and time. He appears well-developed and well-nourished.  HENT:  Head: Normocephalic and atraumatic.  Eyes: EOM are normal. Pupils are equal, round, and reactive to light.  Neck: Normal range of motion. Neck supple.  Cardiovascular: Normal rate, normal heart sounds and intact distal pulses.   Pulmonary/Chest: Effort normal and breath sounds normal.  Abdominal: Bowel sounds are normal. He exhibits no distension. There is no tenderness.  Musculoskeletal: He exhibits tenderness. He exhibits no edema.  Multiple superficial linear lacerations to R heel with a large midline laceration over the distal achilles insertion  Neurological: He is alert and oriented to person, place, and time. He has normal strength. No cranial nerve deficit or sensory deficit.  Skin: Skin is warm and dry. No rash noted.  Psychiatric: He  has a normal mood and affect.    ED Course  Procedures (including critical care time) Medications  ondansetron (ZOFRAN) 4 MG/2ML injection (4 mg  Given 07/16/13 2223)  HYDROmorphone (DILAUDID) 1 MG/ML injection (1 mg  Given 07/16/13 2222)   DIAGNOSTIC STUDIES: Oxygen Saturation is 100% on RA, normal by my interpretation.    COORDINATION OF CARE: 10:25 PM- Discussed treatment plan with pt. Pt agrees to plan.  LACERATION REPAIR Performed by: Pollyann SavoySHELDON,CHARLES B. Consent: Verbal consent obtained. Risks and benefits: risks, benefits and alternatives were discussed Patient identity confirmed: provided demographic data Time out performed prior to procedure Prepped and Draped in normal sterile fashion Wound explored Laceration Location: R heel Laceration Length:  5cm No Foreign Bodies seen or palpated Anesthesia: local infiltration Local anesthetic: lidocaine 2% no epinephrine Anesthetic total: 5 ml Irrigation method: syringe Amount of cleaning: extensive Wound explored, ?partial injury to achilles tendon Large tissue defect with superficial linea lacerations surrounding not amenable to closure Small pieces of devitalized tissue debrided with scissors Patient tolerance: Patient tolerated the procedure well with no immediate complications.    Labs Review Labs Reviewed - No data to display Imaging Review Dg Foot Complete Right  07/16/2013   CLINICAL DATA:  Right foot laceration.  EXAM: RIGHT FOOT COMPLETE - 3+ VIEW  COMPARISON:  None.  FINDINGS: There is no evidence of fracture or dislocation. There is no evidence of arthropathy or other focal bone abnormality. Laceration of soft tissues posterior to calcaneus are noted. No radiopaque foreign body is seen.  IMPRESSION: No fracture or dislocation is noted. Soft tissue laceration is seen posterior to calcaneus.   Electronically Signed   By: Roque LiasJames  Green M.D.   On: 07/16/2013 23:13    EKG Interpretation   None       MDM   1. Laceration of heel, right, initial encounter     Large tissue defect, not amenable to primary repair, extensive irrigation, non-stick dressing and posterior splint in neutral position applied. Will discuss with Ortho for followup  I personally performed the services described in this documentation, which was scribed in my presence. The recorded information has been reviewed and is accurate.      Charles B. Bernette MayersSheldon, MD 07/16/13 2351

## 2013-07-17 NOTE — ED Notes (Signed)
Patients wound dressed with non-stick dressing and wrapped. Patient placed in posterior splint. Patient coached on care of splint. He was told not to get it wet or take it off for any reason other than circulation issues which were discussed at length with both the patient and family member (a Engineer, civil (consulting)nurse) at the bedside. Patient gives some pushback to the appropriate care of slpint until he follows up with ortho on Monday. Patient continues to question why the splint cannot be removed. Patient states he has things to do that he cannot do in the splint. Patient cautioned on possibilities if he removes splint/bandage prematurely to include further damage and infection. Patient family member receptive to advice and agreeable to helping. Patient also given crutches in the ED, and he demonstrates appropriate use and is agreeable at discharge with plan of care.

## 2013-07-21 ENCOUNTER — Ambulatory Visit: Payer: BC Managed Care – PPO | Admitting: Infectious Disease

## 2014-06-01 ENCOUNTER — Emergency Department (HOSPITAL_BASED_OUTPATIENT_CLINIC_OR_DEPARTMENT_OTHER)
Admission: EM | Admit: 2014-06-01 | Discharge: 2014-06-01 | Disposition: A | Discharge status: Home / Self Care | Payer: BC Managed Care – PPO | Attending: Emergency Medicine | Admitting: Emergency Medicine

## 2014-06-01 ENCOUNTER — Encounter (HOSPITAL_BASED_OUTPATIENT_CLINIC_OR_DEPARTMENT_OTHER): Payer: Self-pay | Admitting: Emergency Medicine

## 2014-06-01 DIAGNOSIS — N341 Nonspecific urethritis: Secondary | ICD-10-CM | POA: Insufficient documentation

## 2014-06-01 DIAGNOSIS — F419 Anxiety disorder, unspecified: Secondary | ICD-10-CM | POA: Insufficient documentation

## 2014-06-01 DIAGNOSIS — Z720 Tobacco use: Secondary | ICD-10-CM | POA: Diagnosis not present

## 2014-06-01 DIAGNOSIS — R369 Urethral discharge, unspecified: Secondary | ICD-10-CM | POA: Diagnosis present

## 2014-06-01 DIAGNOSIS — Z202 Contact with and (suspected) exposure to infections with a predominantly sexual mode of transmission: Secondary | ICD-10-CM | POA: Diagnosis not present

## 2014-06-01 DIAGNOSIS — F329 Major depressive disorder, single episode, unspecified: Secondary | ICD-10-CM | POA: Diagnosis not present

## 2014-06-01 DIAGNOSIS — N342 Other urethritis: Secondary | ICD-10-CM

## 2014-06-01 DIAGNOSIS — Z21 Asymptomatic human immunodeficiency virus [HIV] infection status: Secondary | ICD-10-CM | POA: Insufficient documentation

## 2014-06-01 LAB — URINALYSIS, ROUTINE W REFLEX MICROSCOPIC
Glucose, UA: NEGATIVE mg/dL
Ketones, ur: 15 mg/dL — AB
NITRITE: NEGATIVE
PH: 6.5 (ref 5.0–8.0)
Protein, ur: 30 mg/dL — AB
SPECIFIC GRAVITY, URINE: 1.024 (ref 1.005–1.030)
UROBILINOGEN UA: 1 mg/dL (ref 0.0–1.0)

## 2014-06-01 LAB — URINE MICROSCOPIC-ADD ON

## 2014-06-01 MED ORDER — CEFTRIAXONE SODIUM 250 MG IJ SOLR
250.0000 mg | Freq: Once | INTRAMUSCULAR | Status: AC
Start: 2014-06-01 — End: 2014-06-01
  Administered 2014-06-01: 250 mg via INTRAMUSCULAR
  Filled 2014-06-01: qty 250

## 2014-06-01 MED ORDER — AZITHROMYCIN 250 MG PO TABS
1000.0000 mg | ORAL_TABLET | Freq: Once | ORAL | Status: AC
  Administered 2014-06-01: 1000 mg via ORAL
  Filled 2014-06-01: qty 4

## 2014-06-01 NOTE — ED Notes (Signed)
Pt reports that he has had new partner recently and has had some penile discharge, reports not using condoms. No other symptoms other than general malaise

## 2014-06-01 NOTE — ED Provider Notes (Signed)
CSN: [id number]     Arrival date & time 06/01/14  0535 History   First MD Initiated Contact with Patient 06/01/14 0550     Chief Complaint  Patient presents with  . Penile Discharge     (Consider location/radiation/quality/duration/timing/severity/associated sxs/prior Treatment) HPI Comments: Patient is a 43 year old male with history of HIV disease. He presents with complaints of suspected sexually transmitted disease. He states he had a sexual encounter with a new partner which she met on the HIV dating site. He is now having burning with urination and urethral discomfort.  Patient is a 43 y.o. male presenting with penile discharge. The history is provided by the patient.  Penile Discharge This is a new problem. Episode onset: 3 days ago. The problem occurs constantly. The problem has been gradually worsening. Pertinent negatives include no chest pain and no abdominal pain. Exacerbated by: Urinating. Nothing relieves the symptoms. He has tried nothing for the symptoms. The treatment provided no relief.    Past Medical History  Diagnosis Date  . HIV (human immunodeficiency virus infection)   . Depression   . Anxiety    Past Surgical History  Procedure Laterality Date  . Hernia repair Left ` 2007   History reviewed. No pertinent family history. History  Substance Use Topics  . Smoking status: Current Every Day Smoker -- 1.00 packs/day for 25 years    Types: Cigarettes  . Smokeless tobacco: Never Used  . Alcohol Use: No    Review of Systems  Cardiovascular: Negative for chest pain.  Gastrointestinal: Negative for abdominal pain.  Genitourinary: Positive for discharge.  All other systems reviewed and are negative.     Allergies  Dapsone and Sulfamethoxazole-trimethoprim  Home Medications   Prior to Admission medications   Medication Sig Start Date End Date Taking? Authorizing Provider  darunavir (PREZISTA) 400 MG tablet Take 2 tablets (800 mg total) by mouth daily.  04/23/11  Yes Truman Hayward, MD  emtricitabine-tenofovir (TRUVADA) 200-300 MG per tablet Take 1 tablet by mouth daily. 04/23/11  Yes Truman Hayward, MD  ritonavir (NORVIR) 100 MG capsule Take 1 capsule (100 mg total) by mouth daily. 06/18/12  Yes Truman Hayward, MD  amoxicillin-clavulanate (AUGMENTIN) 875-125 MG per tablet Take 1 tablet by mouth every 12 (twelve) hours. 07/16/13   Charles B. Karle Starch, MD  ciprofloxacin (CIPRO) 500 MG tablet Take 1 tablet (500 mg total) by mouth 2 (two) times daily. 02/11/13   Francesca Oman, DO  LORazepam (ATIVAN) 1 MG tablet Take 2 mg by mouth every 8 (eight) hours.    Historical Provider, MD  oxyCODONE-acetaminophen (PERCOCET/ROXICET) 5-325 MG per tablet Take 1-2 tablets by mouth every 6 (six) hours as needed for severe pain. 07/16/13   Charles B. Karle Starch, MD   BP 129/89 mmHg  Pulse 108  Temp(Src) 98.4 F (36.9 C) (Oral)  Resp 18  Ht 6' (1.829 m)  Wt 180 lb (81.647 kg)  BMI 24.41 kg/m2  SpO2 100% Physical Exam  Constitutional: He is oriented to person, place, and time. He appears well-developed and well-nourished. No distress.  HENT:  Head: Normocephalic and atraumatic.  Neck: Normal range of motion. Neck supple.  Genitourinary:  There is inflammation and discharge from the urethra. The penis and testicles appear otherwise normal.  Musculoskeletal: Normal range of motion.  Neurological: He is alert and oriented to person, place, and time.  Skin: Skin is warm and dry. He is not diaphoretic.  Nursing note and vitals reviewed.  ED Course  Procedures (including critical care time) Labs Review Labs Reviewed  GC/CHLAMYDIA PROBE AMP  URINALYSIS, ROUTINE W REFLEX MICROSCOPIC    Imaging Review No results found.   EKG Interpretation None      MDM   Final diagnoses:  None    GC and Chlamydia cultures are pending. We'll treat with Rocephin and Zithromax presumptively as I strongly suspect he has a sexually transmitted disease. To  return as needed for any problems.    Veryl Speak, MD 06/01/14 [phone]

## 2014-06-01 NOTE — Discharge Instructions (Signed)
We will call you if your cultures indicate that you require further treatment or action.   Sexually Transmitted Disease A sexually transmitted disease (STD) is a disease or infection that may be passed (transmitted) from person to person, usually during sexual activity. This may happen by way of saliva, semen, blood, vaginal mucus, or urine. Common STDs include:   Gonorrhea.   Chlamydia.   Syphilis.   HIV and AIDS.   Genital herpes.   Hepatitis B and C.   Trichomonas.   Human papillomavirus (HPV).   Pubic lice.   Scabies.  Mites.  Bacterial vaginosis. WHAT ARE CAUSES OF STDs? An STD may be caused by bacteria, a virus, or parasites. STDs are often transmitted during sexual activity if one person is infected. However, they may also be transmitted through nonsexual means. STDs may be transmitted after:   Sexual intercourse with an infected person.   Sharing sex toys with an infected person.   Sharing needles with an infected person or using unclean piercing or tattoo needles.  Having intimate contact with the genitals, mouth, or rectal areas of an infected person.   Exposure to infected fluids during birth. WHAT ARE THE SIGNS AND SYMPTOMS OF STDs? Different STDs have different symptoms. Some people may not have any symptoms. If symptoms are present, they may include:   Painful or bloody urination.   Pain in the pelvis, abdomen, vagina, anus, throat, or eyes.   A skin rash, itching, or irritation.  Growths, ulcerations, blisters, or sores in the genital and anal areas.  Abnormal vaginal discharge with or without bad odor.   Penile discharge in men.   Fever.   Pain or bleeding during sexual intercourse.   Swollen glands in the groin area.   Yellow skin and eyes (jaundice). This is seen with hepatitis.   Swollen testicles.  Infertility.  Sores and blisters in the mouth. HOW ARE STDs DIAGNOSED? To make a diagnosis, your health care  provider may:   Take a medical history.   Perform a physical exam.   Take a sample of any discharge to examine.  Swab the throat, cervix, opening to the penis, rectum, or vagina for testing.  Test a sample of your first morning urine.   Perform blood tests.   Perform a Pap test, if this applies.   Perform a colposcopy.   Perform a laparoscopy.  HOW ARE STDs TREATED? Treatment depends on the STD. Some STDs may be treated but not cured.   Chlamydia, gonorrhea, trichomonas, and syphilis can be cured with antibiotic medicine.   Genital herpes, hepatitis, and HIV can be treated, but not cured, with prescribed medicines. The medicines lessen symptoms.   Genital warts from HPV can be treated with medicine or by freezing, burning (electrocautery), or surgery. Warts may come back.   HPV cannot be cured with medicine or surgery. However, abnormal areas may be removed from the cervix, vagina, or vulva.   If your diagnosis is confirmed, your recent sexual partners need treatment. This is true even if they are symptom-free or have a negative culture or evaluation. They should not have sex until their health care providers say it is okay. HOW CAN I REDUCE MY RISK OF GETTING AN STD? Take these steps to reduce your risk of getting an STD:  Use latex condoms, dental dams, and water-soluble lubricants during sexual activity. Do not use petroleum jelly or oils.  Avoid having multiple sex partners.  Do not have sex with someone who has other  sex partners.  Do not have sex with anyone you do not know or who is at high risk for an STD.  Avoid risky sex practices that can break your skin.  Do not have sex if you have open sores on your mouth or skin.  Avoid drinking too much alcohol or taking illegal drugs. Alcohol and drugs can affect your judgment and put you in a vulnerable position.  Avoid engaging in oral and anal sex acts.  Get vaccinated for HPV and hepatitis. If you  have not received these vaccines in the past, talk to your health care provider about whether one or both might be right for you.   If you are at risk of being infected with HIV, it is recommended that you take a prescription medicine daily to prevent HIV infection. This is called pre-exposure prophylaxis (PrEP). You are considered at risk if:  You are a man who has sex with other men (MSM).  You are a heterosexual man or woman and are sexually active with more than one partner.  You take drugs by injection.  You are sexually active with a partner who has HIV.  Talk with your health care provider about whether you are at high risk of being infected with HIV. If you choose to begin PrEP, you should first be tested for HIV. You should then be tested every 3 months for as long as you are taking PrEP.  WHAT SHOULD I DO IF I THINK I HAVE AN STD?  See your health care provider.   Tell your sexual partner(s). They should be tested and treated for any STDs.  Do not have sex until your health care provider says it is okay. WHEN SHOULD I GET IMMEDIATE MEDICAL CARE? Contact your health care provider right away if:   You have severe abdominal pain.  You are a man and notice swelling or pain in your testicles.  You are a woman and notice swelling or pain in your vagina. Document Released: 09/14/2002 Document Revised: 06/29/2013 Document Reviewed: 01/12/2013 [facility] Patient Information 2015 [place], [place]. This information is not intended to replace advice given to you by your health care provider. Make sure you discuss any questions you have with your health care provider.

## 2014-06-02 LAB — GC/CHLAMYDIA PROBE AMP
CT Probe RNA: NEGATIVE
GC Probe RNA: POSITIVE — AB

## 2014-06-03 ENCOUNTER — Telehealth (HOSPITAL_BASED_OUTPATIENT_CLINIC_OR_DEPARTMENT_OTHER): Payer: Self-pay | Admitting: Emergency Medicine

## 2014-06-03 NOTE — Telephone Encounter (Signed)
Post ED Visit - Positive Culture Follow-up  Culture report reviewed by antimicrobial stewardship pharmacist: []  Wes Dulaney, Pharm.D., BCPS []  Celedonio MiyamotoJeremy Frens, Pharm.D., BCPS []  Georgina PillionElizabeth Martin, Pharm.D., BCPS []  Fifth WardMinh Pham, [place]Pharm.D., BCPS, AAHIVP []  Estella HuskMichelle Turner, Pharm.D., BCPS, AAHIVP []  Babs BertinHaley Baird, [address]Pharm[address]D.   Positive gonorrhea culture Treated with rocephin and zithromax, organism sensitive to the same and no further patient follow-up is required at this time.  Berle MullMiller, Clyda Smyth 06/03/2014, 9:39 AM

## 2014-06-06 ENCOUNTER — Telehealth (HOSPITAL_COMMUNITY): Payer: Self-pay

## 2014-06-06 NOTE — Progress Notes (Signed)
Ok very good.

## 2014-06-10 ENCOUNTER — Telehealth: Payer: Self-pay | Admitting: *Deleted

## 2014-09-06 IMAGING — CT CT ABD-PELV W/O CM
1 of 2 series · 15 of 32 positions shown, 20 images · non-contrast
Comparison: 01/23/2012

CLINICAL DATA: Abdominal pain

CT ABDOMEN AND PELVIS WITHOUT CONTRAST
TECHNIQUE: Multidetector CT imaging of the abdomen and pelvis was
performed following the standard protocol without intravenous
contrast.

[Series 3: abd/pel w/o · axial · non-contrast · 0.87mm/px · z∈[-436,-16]mm · 15 of 93 slices shown, 20 images]
[im 5/93  soft-tissue]
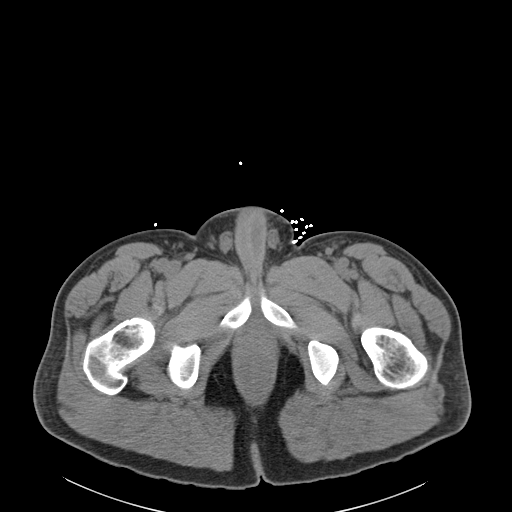
[im 5/93  bone]
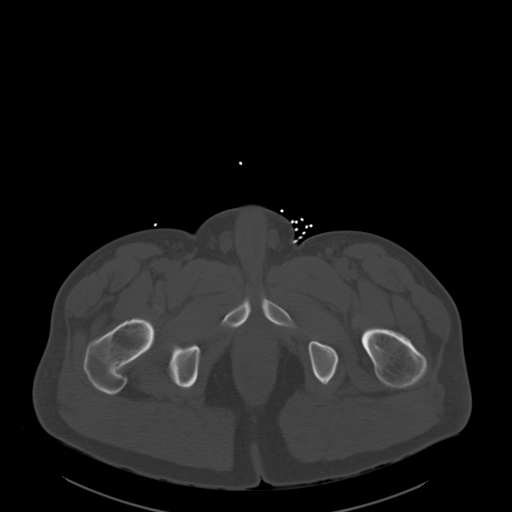
[im 13/93  soft-tissue]
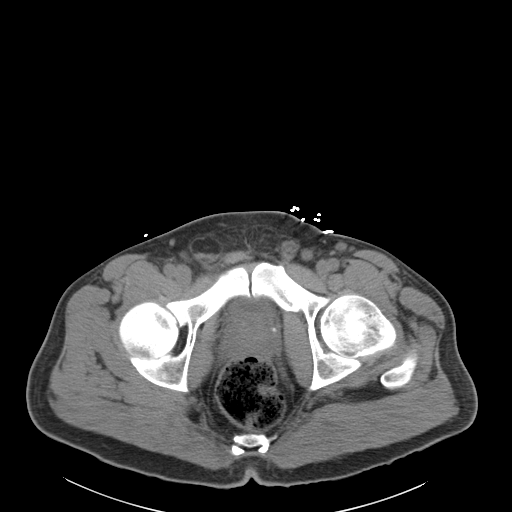
[im 17/93  soft-tissue]
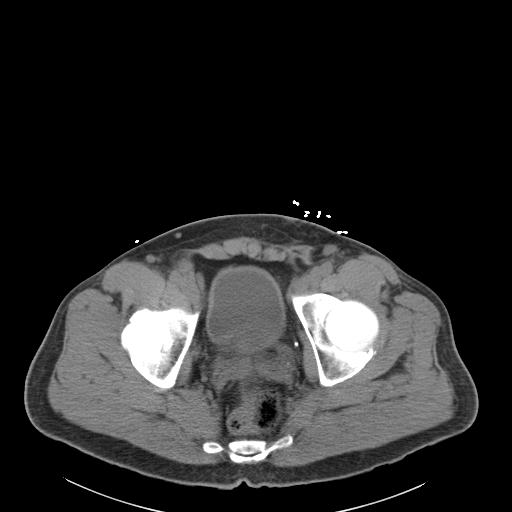
[im 25/93  soft-tissue]
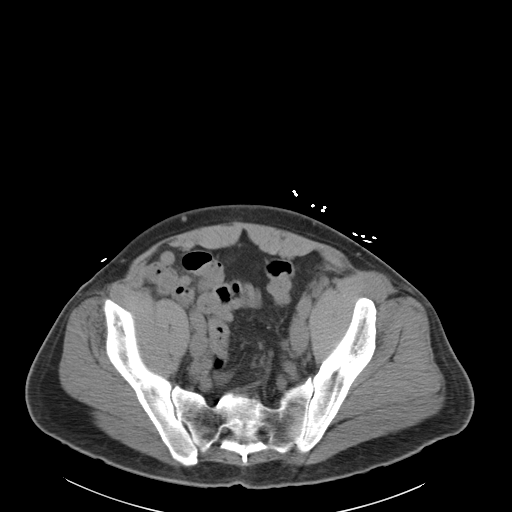
[im 33/93  soft-tissue]
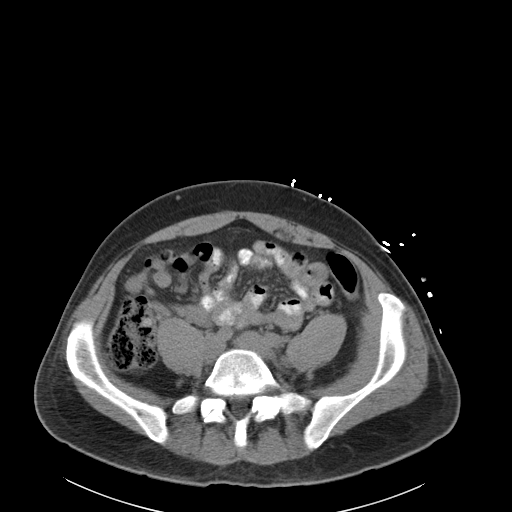
[im 37/93  soft-tissue]
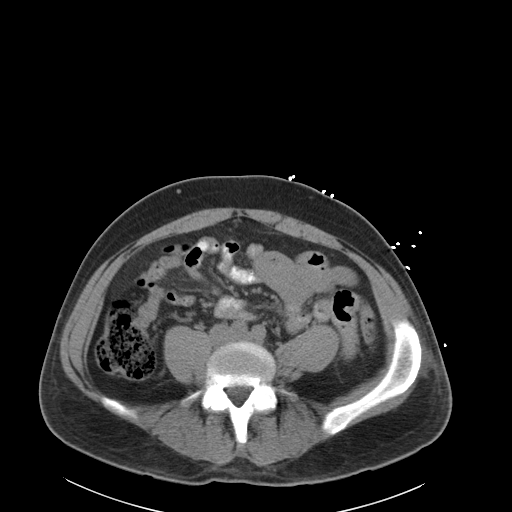
[im 45/93  soft-tissue]
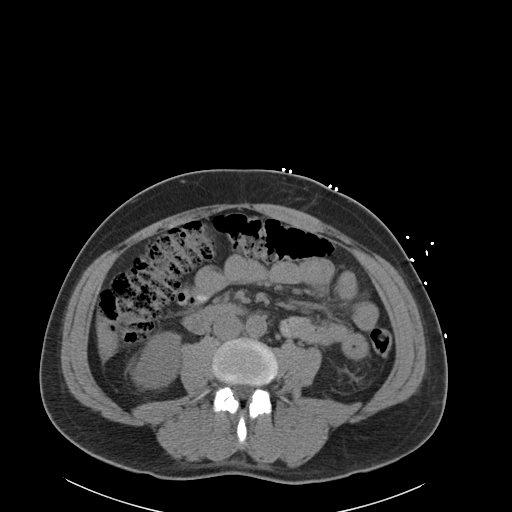
[im 49/93  soft-tissue]
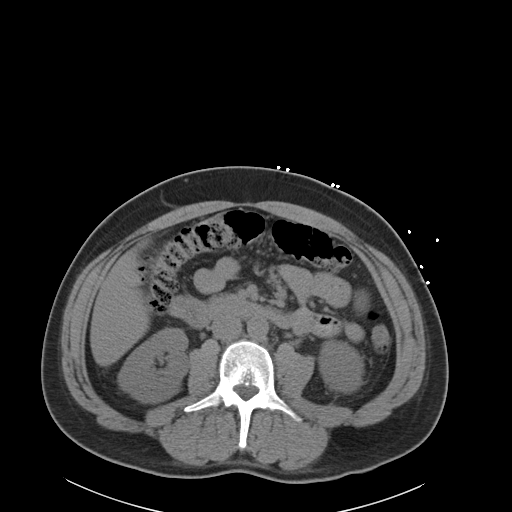
[im 57/93  soft-tissue]
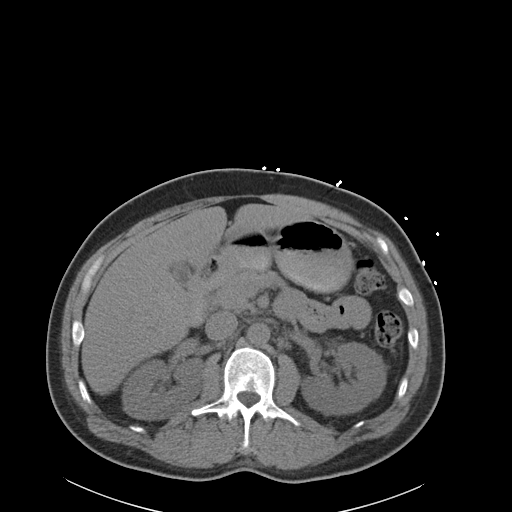
[im 57/93  bone]
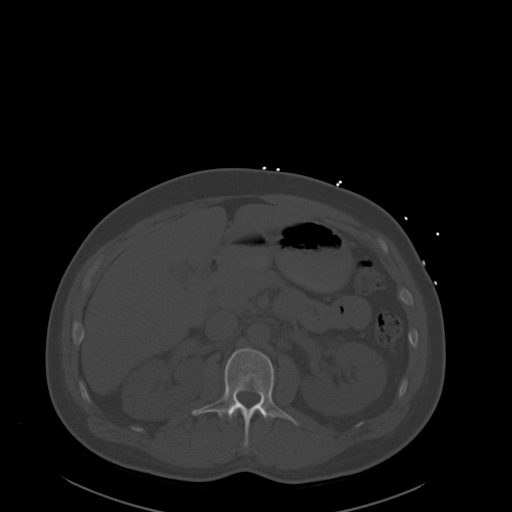
[im 61/93  soft-tissue]
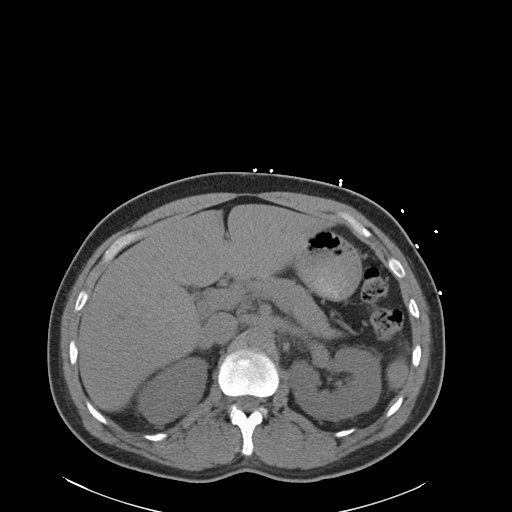
[im 69/93  soft-tissue]
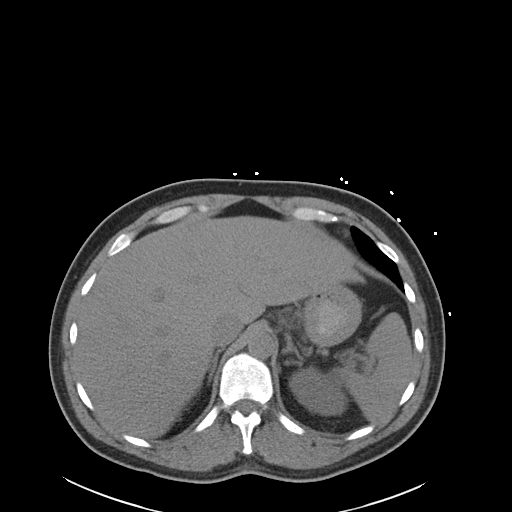
[im 77/93  soft-tissue]
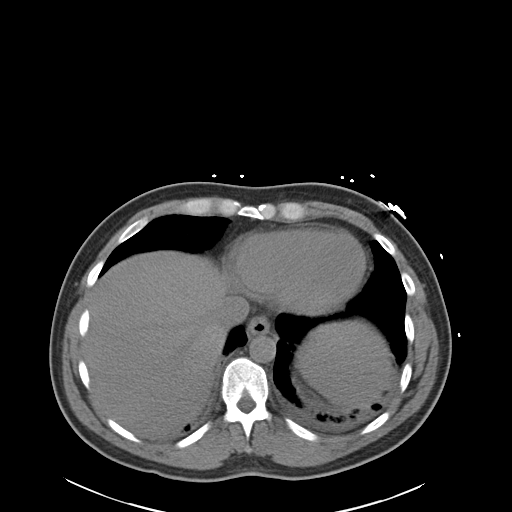
[im 77/93  lung]
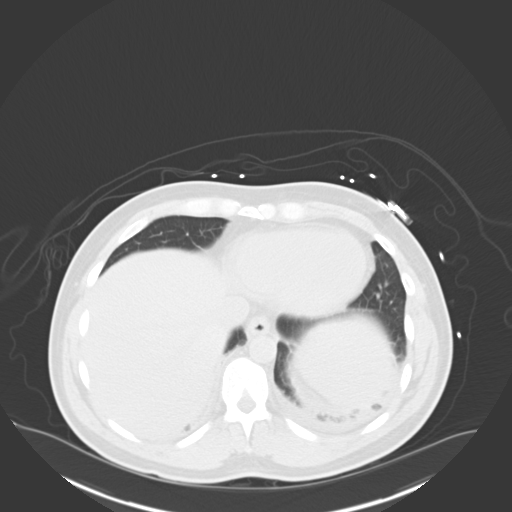
[im 81/93  soft-tissue]
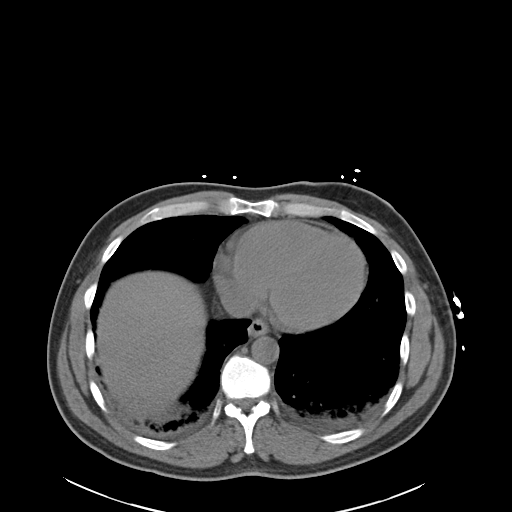
[im 81/93  lung]
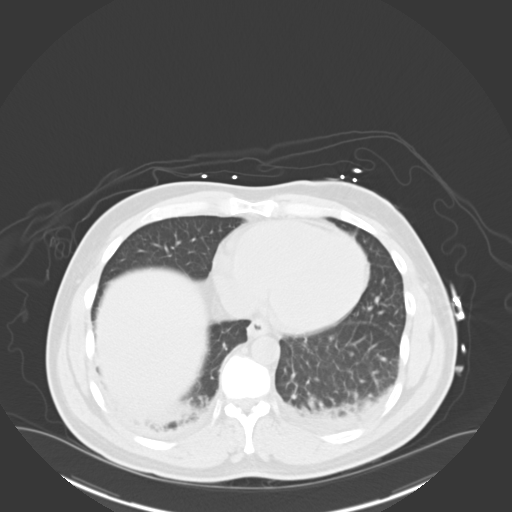
[im 85/93  lung]
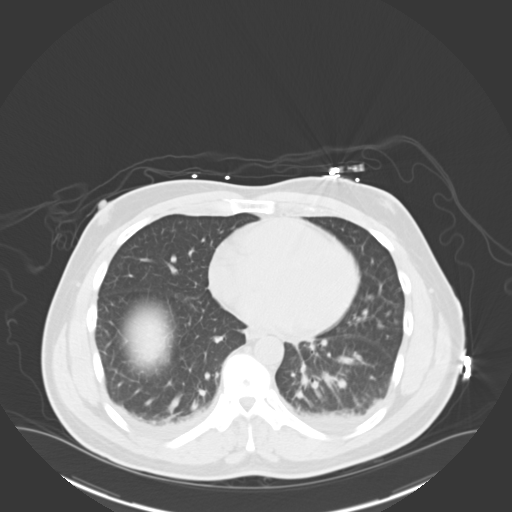
[im 89/93  soft-tissue]
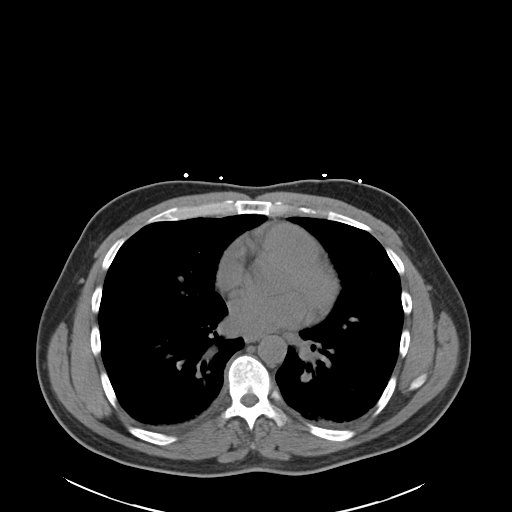
[im 89/93  lung]
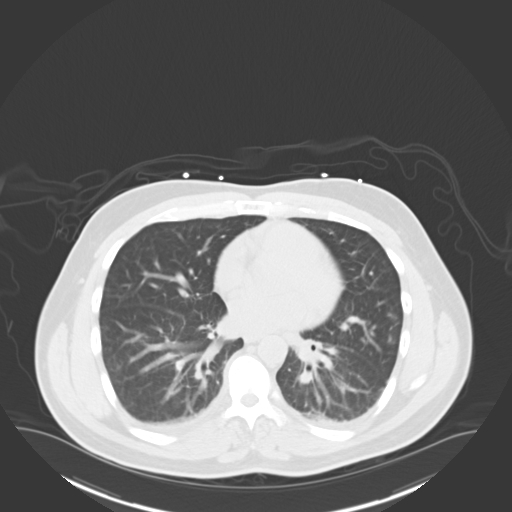

[15 of 32 positions shown; findings below may reference images not displayed]

FINDINGS: The lung bases demonstrate some mild dependent
atelectatic changes. A tiny left-sided pleural effusion is seen.

The liver, gallbladder, spleen, adrenal glands and pancreas are
normal in their CT appearance.  Kidneys are well visualized
bilaterally and reveal no renal calculi or obstructive changes.

The appendix is not well visualized although no inflammatory
changes are noted to suggest appendicitis.  The bladder is
partially distended.  Multiple phleboliths are seen.  No pelvic
mass lesion or side wall abnormality is noted.
IMPRESSION: Left-sided pleural effusion and bibasilar atelectasis.

No other focal abnormality is seen.
